# Patient Record
Sex: Female | Born: 1970 | Race: White | Hispanic: No
Health system: Western US, Academic
[De-identification: ages and names within clinical notes are randomized; demographics above are authoritative.]

## PROBLEM LIST (undated history)

## (undated) DIAGNOSIS — G43919 Migraine, unspecified, intractable, without status migrainosus: Secondary | ICD-10-CM

## (undated) DIAGNOSIS — F3289 Other specified depressive episodes: Secondary | ICD-10-CM

## (undated) DIAGNOSIS — Z87898 Personal history of other specified conditions: Secondary | ICD-10-CM

## (undated) DIAGNOSIS — F5102 Adjustment insomnia: Secondary | ICD-10-CM

## (undated) DIAGNOSIS — Z87820 Personal history of traumatic brain injury: Secondary | ICD-10-CM

## (undated) DIAGNOSIS — F4310 Post-traumatic stress disorder, unspecified: Secondary | ICD-10-CM

## (undated) DIAGNOSIS — J309 Allergic rhinitis, unspecified: Secondary | ICD-10-CM

## (undated) HISTORY — DX: Allergic rhinitis, unspecified: J30.9

## (undated) HISTORY — DX: Other specified depressive episodes: F32.89

## (undated) HISTORY — DX: Post-traumatic stress disorder, unspecified: F43.10

## (undated) HISTORY — DX: Personal history of other specified conditions: Z87.898

## (undated) HISTORY — DX: Personal history of traumatic brain injury: Z87.820

## (undated) HISTORY — DX: Migraine, unspecified, intractable, without status migrainosus: G43.919

## (undated) HISTORY — DX: Adjustment insomnia: F51.02

## (undated) NOTE — Progress Notes (Signed)
 Formatting of this note is different from the original.  SOPHIAROSE EADES  Jun 23, 1971    Subjective   Patient ID: Christina Mathews is a 5 YO female.    Chief Complaint   Patient presents with    Follow-up     HPI    Presents to follow up amenorrhea.  Still taking birth control pills, no menses.  Still notes hot flashes and night sweats.  Thinks she may have the start of a yeast infection.    Review of Systems    Objective   Vitals:    01/12/22 0830   BP: 138/80   Weight: 199 lb (90.3 kg)     Body mass index is 36.11 kg/m.    Physical Exam  Constitutional:       General: She is not in acute distress.     Appearance: She is obese. She is not ill-appearing or diaphoretic.   Genitourinary:      Right Labia: No rash, tenderness, lesions or skin changes.     Left Labia: No tenderness, lesions, skin changes or rash.     Vaginal discharge (yeast) and tenderness present.      No vaginal erythema, bleeding or ulceration.      No vaginal atrophy present.  Neurological:      Mental Status: She is alert.   Vitals reviewed. Exam conducted with a chaperone present.     Procedures      Assessment       SNOMED CT(R)    1. Candidiasis of female genitalia  CANDIDAL VULVOVAGINITIS fluconazole (DIFLUCAN) 150 mg tablet     2. Prolonged amenorrhea  AMENORRHEA              Plan     Patient doing well over all.  Plans to continue OCPs at this time but understands should transition to Transdermal patches for treatment of vasomotor symptoms and decrease risk of DVT, CV incidents.     Given rx for acute yeast infection and refill for international trip next month.     Patient Instructions   Instructions given today for  Follow up  , Method of instructions : Encouraged to call with any questions.  Return if symptoms worsen or fail to improve.      Electronically signed by Nadean Corwin, MD at 01/12/2022  9:28 AM EDT

---

## 1999-07-28 DIAGNOSIS — S02109A Fracture of base of skull, unspecified side, initial encounter for closed fracture: Secondary | ICD-10-CM

## 1999-07-28 HISTORY — DX: Fracture of base of skull, unspecified side, initial encounter for closed fracture: S02.109A

## 2003-11-11 ENCOUNTER — Ambulatory Visit: Payer: Self-pay

## 2010-09-03 ENCOUNTER — Encounter (INDEPENDENT_AMBULATORY_CARE_PROVIDER_SITE_OTHER): Payer: Self-pay | Admitting: Family Medicine

## 2010-09-03 ENCOUNTER — Ambulatory Visit (INDEPENDENT_AMBULATORY_CARE_PROVIDER_SITE_OTHER): Payer: No Typology Code available for payment source | Admitting: Family Medicine

## 2010-09-03 VITALS — BP 124/70 | HR 84 | Temp 99.2°F | Ht 62.5 in | Wt 162.0 lb

## 2010-09-03 MED ORDER — SUMATRIPTAN 5 MG/ACT NA SOLN
NASAL | Status: DC
Start: 2010-09-03 — End: 2010-10-30

## 2010-09-03 MED ORDER — FLUTICASONE PROPIONATE 50 MCG/ACT NA SUSP
NASAL | Status: DC
Start: 2010-09-03 — End: 2011-10-27
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
## 2010-09-08 ENCOUNTER — Ambulatory Visit (INDEPENDENT_AMBULATORY_CARE_PROVIDER_SITE_OTHER): Payer: No Typology Code available for payment source | Admitting: Obstetrics & Gynecology

## 2010-09-08 ENCOUNTER — Other Ambulatory Visit (HOSPITAL_BASED_OUTPATIENT_CLINIC_OR_DEPARTMENT_OTHER)
Admit: 2010-09-08 | Discharge: 2010-09-08 | Disposition: A | Discharge status: Home / Self Care | Payer: No Typology Code available for payment source | Attending: Family Medicine | Admitting: Family Medicine

## 2010-09-08 ENCOUNTER — Encounter (INDEPENDENT_AMBULATORY_CARE_PROVIDER_SITE_OTHER): Payer: Self-pay | Admitting: Obstetrics & Gynecology

## 2010-09-08 VITALS — BP 126/80 | HR 100 | Temp 98.3°F | Wt 162.0 lb

## 2010-09-08 DIAGNOSIS — Z01419 Encounter for gynecological examination (general) (routine) without abnormal findings: Secondary | ICD-10-CM | POA: Insufficient documentation
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
## 2010-09-08 NOTE — Progress Notes (Signed)
Why is patient here today? Patient is here for her annual gyn exam. She would also like to talk about future plans to conceive.   Does patient need refills? no  Does patient need a referral? no  Does patient need a letter or need forms filled out? no  Lab results? no  Please check health mainenance issues and flag for the doctor: tetanus, pap

## 2010-09-10 LAB — CERVICAL CANCER SCREENING: Cytologic Impression: NEGATIVE

## 2010-09-25 ENCOUNTER — Ambulatory Visit: Payer: No Typology Code available for payment source | Attending: Otolaryngology | Admitting: Otolaryngology

## 2010-09-25 DIAGNOSIS — M26609 Unspecified temporomandibular joint disorder, unspecified side: Secondary | ICD-10-CM | POA: Insufficient documentation

## 2010-09-25 DIAGNOSIS — S0993XA Unspecified injury of face, initial encounter: Secondary | ICD-10-CM | POA: Insufficient documentation

## 2010-09-25 DIAGNOSIS — S199XXA Unspecified injury of neck, initial encounter: Secondary | ICD-10-CM | POA: Insufficient documentation

## 2010-09-25 DIAGNOSIS — J309 Allergic rhinitis, unspecified: Secondary | ICD-10-CM | POA: Insufficient documentation

## 2010-10-03 ENCOUNTER — Ambulatory Visit: Payer: No Typology Code available for payment source | Attending: Otolaryngology

## 2010-10-03 ENCOUNTER — Other Ambulatory Visit: Payer: Self-pay | Admitting: Otolaryngology

## 2010-10-03 LAB — PR CT MAXILLOFACIAL AREA W/O CONTRAST

## 2010-10-21 ENCOUNTER — Other Ambulatory Visit: Payer: Self-pay

## 2010-10-21 ENCOUNTER — Other Ambulatory Visit (INDEPENDENT_AMBULATORY_CARE_PROVIDER_SITE_OTHER): Payer: Self-pay | Admitting: Family Medicine

## 2010-10-21 NOTE — Telephone Encounter (Signed)
From: Christina Mathews   To: Christina Mathews   Sent: Tue Oct 21, 2010 11:01 AM   Subject: Medication Renewal Request    Original authorizing provider: [name], MD    Christina Mathews would like a refill of the following medications:  SUMAtriptan (IMITREX) 5 MG/ACT Nasal Solution [[name], MD]    Preferred pharmacy: [id number]    Comment:  Request: 1. NOT generic for Necon 0.5/35, 28 2. Need higher dosage of SUMAtriptan: used to taking 50mg  tablets and 5mg  nasal not effective at all.  Thanks.    Medication renewals requested in this message routed to other providers:  Norethindrone-Eth Estradiol (NECON 0.5/35, 28,) 0.5-35 MG-MCG Oral Tab [Pcp Outside, MD]

## 2010-10-22 MED ORDER — NECON 0.5/35 (28) 0.5-35 MG-MCG OR TABS
ORAL_TABLET | ORAL | Status: DC
Start: 2010-10-22 — End: 2010-12-23

## 2010-10-22 NOTE — Telephone Encounter (Signed)
Patient is requesting higher dose of sumatriptan. States 5mg  nasal not effective at all.  Drug and pharmacy loaded if approved.

## 2010-10-22 NOTE — Telephone Encounter (Signed)
Patient with migraine with Aura on OCP     Will defer action on this to pcp

## 2010-10-23 MED ORDER — SUMATRIPTAN 20 MG/ACT NA SOLN
NASAL | Status: DC
Start: 2010-10-22 — End: 2011-12-04

## 2010-10-27 MED ORDER — NORETHINDRONE-ETH ESTRADIOL 0.5-35 MG-MCG OR TABS
ORAL_TABLET | ORAL | Status: DC
Start: 2010-10-25 — End: 2010-12-23

## 2010-10-29 ENCOUNTER — Encounter (INDEPENDENT_AMBULATORY_CARE_PROVIDER_SITE_OTHER): Payer: Self-pay | Admitting: Family Medicine

## 2010-11-13 ENCOUNTER — Telehealth (INDEPENDENT_AMBULATORY_CARE_PROVIDER_SITE_OTHER): Payer: Self-pay | Admitting: Family Medicine

## 2010-11-13 ENCOUNTER — Ambulatory Visit (INDEPENDENT_AMBULATORY_CARE_PROVIDER_SITE_OTHER): Payer: No Typology Code available for payment source | Admitting: Wound Care

## 2010-11-13 ENCOUNTER — Encounter (HOSPITAL_BASED_OUTPATIENT_CLINIC_OR_DEPARTMENT_OTHER): Payer: No Typology Code available for payment source

## 2010-11-13 VITALS — BP 110/80 | HR 68 | Temp 98.9°F | Resp 16 | Wt 163.0 lb

## 2010-11-13 NOTE — Telephone Encounter (Signed)
Triage Nurse Telephone Encounter  Chief Complaint: heartburn, nausea  Reviewed Problem List, Medications and Allergies: YES    Description of symptoms: Woke up with nausea, heartburn/chest pains in middle of night on Tuesday.  Took zantac.  Chest pain--middle of chest.  Headache and chest pains and nausea continue.  Slept all day yesterday,  Lots of fatigue  Took another zantac and tums but that doesn't seem to help.  Still has headache, nausea, chest pains.  ROS: negative per protocol except as noted in history  Protocol used for assessment: nausea  Recommended disposition: See provider within 24 hours  Caller agrees:  YES    PLAN: appt made  Home care instructions provided:  No  Instructed to call back or seek tx if sxs worsen or has new concerns: YES   Caller understands:  YES  Follow up call needed  NO  Reference used: [employer] 3rd edition, Telephone Triage Protocols for Nurses

## 2010-11-13 NOTE — Progress Notes (Signed)
Pt is a 40 year old female  here today for headache and nausea.  Reviewed history obtained by MA and agree on 11/13/2010    Pt states that she felt chest pain in sternum area in the middle of the night. Started 2 days back. It is a dull pressure. Thought it was heartburn but normal medication do not work. Pain 5-7/ 10.. Was bad yesterday. She also started with abdominal pain started after the pain.  She also notes headache -terrible headache /dizzy and neck area hurts. She is eating a bland diet . Drinking a lot of water.      Patient Active Problem List   Diagnoses   . ALLERGIC RHINITIS NOS   . 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 CHRONIC SINUSITIS NOS     Current Outpatient Prescriptions   Medication Sig   . B Complex Vitamins (VITAMIN B COMPLEX OR) once daily   . Cetirizine HCl (ZYRTEC) 10 MG Oral Tab 1 TABLET DAILY   . Fluticasone Propionate 50 MCG/ACT Nasal Suspension 1puff each nostril once or twice daily for prevention/control of nasal/sinus congestion   . Multiple Vitamin (MULTI-VITAMIN OR) once daily   . NECON 0.5/35, 28, 0.5-35 MG-MCG Oral Tab 1 tablet daily   . Norethindrone-Eth Estradiol (NECON 0.5/35, 28,) 0.5-35 MG-MCG Oral Tab once daily   . SUMAtriptan 20 MG/ACT Nasal Solution 1 spray into one nostril only at onset of headache.  May repeat 1 time in 2 hours if needed.   . SUMAtriptan Succinate (IMITREX) 25 MG Oral Tab 1 TABLET 1 TIME ONLY,REPEAT EVERY 2 HR AS NEEDED     History   Substance Use Topics   . Smoking status: Never Smoker    . Smokeless tobacco: Not on file   . Alcohol Use: Yes      once per month, if that       review of systems:  constitutional Positive for fatigue  ears, nose, throat Negative   cardiovascular As noted in HPI above  respiratory Negative   gastrointestinal  As noted in HPI above  genitourinary  Negative  musculoskeletal Negative     O/e  Gen alert    BP 110/80  Pulse 68  Temp(Src) 98.9 F (37.2 C) (Temporal)  Resp 16  Wt 163 lb (73.936 kg)  HEENT NEG  RS-LUNGS CLEAR  TO AUSCULTATION,NO RALESRHONCHI OR WHEEZES  [facility] RRR NO  M  ABDOMEN- SOFT, MILD RUQ TENDER,NON DISTENDED +BS   EXT- NO EDEMA,LESIONS,CALF TENDERNESS, 2+ PULSES       ASSESMENT AND PLAN  786.50 CHEST PAIN NOS  (primary encounter diagnosis)  Comment: WITH ABDOMINAL PAIN AND HEADACHE.  Plan: EKG        Not sure what is going on with paitent. She looks ill and I decided to send her to the ER. [facility].    787.02 NAUSEA ALONE  Comment: as above       789.06 ABDOMINAL PAIN EPIGASTRIC  Comment:    Greater than 50% of this visit was spent face to face with the patient in counseling and/or coordination of care, and/or discussing treatment/management options, and/or education with the patient and/or family member.  Total time spent was 30 minutes.

## 2010-11-13 NOTE — Progress Notes (Signed)
Why is patient here today? Pt is here and c/o chest pain, headache, nausea, extreme fatigue, right side abdominal pain, dizzy, and neck pain. Pt states that sx started on Tuesday. Pt states that she has tried Tums and Zantac and that has not help. Pt states that she has also tried Aleve and Advil for her headache and that ash not helped.   Pt noted drink water and eating a blain diet.        Any new significant medical diagnoses since last visit? NO    Refills? NO    Referral? NO    Letter or form? NO    Lab results? NO    Health maintenance issues? YES:

## 2010-11-14 ENCOUNTER — Telehealth (INDEPENDENT_AMBULATORY_CARE_PROVIDER_SITE_OTHER): Payer: Self-pay | Admitting: Wound Care

## 2010-11-14 NOTE — Telephone Encounter (Signed)
Left message for pt to call back at home number.

## 2010-11-14 NOTE — Telephone Encounter (Signed)
Staff I sent this patient to ER yesterday.  Can we see how she is doing ?      Thanks

## 2010-11-14 NOTE — Telephone Encounter (Signed)
Patient states she has been sleeping pretty much all morning.  Didn't get back until 10pm.  Still very exhausted weak and she has an upset stomach but hasnt eaten anything yet either.  Patient states she will call on Monday or Tuesday to follow up again and perhaps make a follow up appointment.

## 2010-12-10 ENCOUNTER — Encounter (INDEPENDENT_AMBULATORY_CARE_PROVIDER_SITE_OTHER): Payer: Self-pay | Admitting: Obstetrics & Gynecology

## 2010-12-10 ENCOUNTER — Ambulatory Visit (INDEPENDENT_AMBULATORY_CARE_PROVIDER_SITE_OTHER): Payer: No Typology Code available for payment source | Admitting: Family Medicine

## 2010-12-10 VITALS — BP 120/72 | HR 76 | Temp 99.0°F | Resp 14 | Wt 162.5 lb

## 2010-12-10 NOTE — Progress Notes (Signed)
SUBJECTIVE:  Christina Mathews is a 40 year old female presents with nonproductive cough, nasal congestion, facial pain (frontal and maxillary in location), ear congestion bilaterally and sore throat over the last 5 days.  Denies the following sx: productive cough, fever and chills.  Pt feels overall has been gradually improving.  Sick contacts:YES: at the office   .  Pt has self-treated so far with sudafed, nasal spray, neti pot..    Patient was seen 1 month ago by Dr Ennis Forts, note reviewed. She was sent to the ED due to the severity of her abdominal pain.  U/s, CT with contrast were negative.  Still getting some right sided abdominal pain intermittently.  She has noted slow digestion over the last couple weeks, not having constipation, just not a lot of stooling.  Her discomfort seems to be worse around eating or after eating.  Recent labs done by her naturopath show normal CMP, including normal liver function tests.   Cholesterol is normal.  CBC is normal.    History   Smoking status   . Never Smoker    Smokeless tobacco   . Not on file       Patient Active Problem List   Diagnoses   . ALLERGIC RHINITIS NOS   . 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 CHRONIC SINUSITIS NOS     Current Outpatient Prescriptions   Medication Sig   . B Complex Vitamins (VITAMIN B COMPLEX OR) once daily   . Cetirizine HCl (ZYRTEC) 10 MG Oral Tab 1 TABLET DAILY   . Fluticasone Propionate 50 MCG/ACT Nasal Suspension 1puff each nostril once or twice daily for prevention/control of nasal/sinus congestion   . Multiple Vitamin (MULTI-VITAMIN OR) once daily   . NECON 0.5/35, 28, 0.5-35 MG-MCG Oral Tab 1 tablet daily   . Norethindrone-Eth Estradiol (NECON 0.5/35, 28,) 0.5-35 MG-MCG Oral Tab once daily   . SUMAtriptan 20 MG/ACT Nasal Solution 1 spray into one nostril only at onset of headache.  May repeat 1 time in 2 hours if needed.   . SUMAtriptan Succinate (IMITREX) 25 MG Oral Tab 1 TABLET 1 TIME ONLY,REPEAT EVERY 2 HR AS NEEDED                  OBJECTIVE:  Constitutional: VS as above, healthy, alert, no distress  ENT: Ears:  R TM: normal, L TM: normal, Nose/sinus: Nares normal. Septum midline. Mucosa normal. Mild tenderness to palpation over the bilateral maxillary sinuses.,  Oropharynx: normal, Neck: supple and no adenopathy  Lungs: clear to auscultation  Heart: normal rate, regular rhythm and no murmurs, clicks, or gallops    Rapid strep screen done: NO    ASSESSMENT: Viral upper respiratory infection    PLAN:  reviewed symptomatic treatment  Followup if worse or no improvement in 5 days

## 2010-12-10 NOTE — Progress Notes (Signed)
Date ROI Received: 12/10/2010    Request Sent to: Dr. Park Meo    Request was sent via:Fax    Contact for follow ZO:XWRUE to [phone]

## 2010-12-10 NOTE — Progress Notes (Signed)
Why is patient here today? Patient is here for a URI x 5 days. She c/o sore throat, post nasal drip, bilateral ear pressure, fatigue and headaches.   No fevers. Several co-workers also sick.     Any new significant medical diagnoses since last visit? NO    Refills? NO    Referral? NO    Letter or form? NO    Lab results? NO    Health maintenance issues? NO

## 2010-12-18 ENCOUNTER — Encounter (INDEPENDENT_AMBULATORY_CARE_PROVIDER_SITE_OTHER): Payer: Self-pay | Admitting: Obstetrics & Gynecology

## 2010-12-19 NOTE — Telephone Encounter (Signed)
I will keep an eye out for LEEP report.  FYI to [facility] RN in case you see these records.

## 2010-12-23 ENCOUNTER — Encounter (INDEPENDENT_AMBULATORY_CARE_PROVIDER_SITE_OTHER): Payer: Self-pay | Admitting: Family Medicine

## 2010-12-23 NOTE — Telephone Encounter (Signed)
Ok to send to pharmacy with continuous dose on prescription information?

## 2010-12-23 NOTE — Telephone Encounter (Signed)
Reviewed, defer to pcp/ original provider  in clinic tomorrow

## 2010-12-24 MED ORDER — NECON 0.5/35 (28) 0.5-35 MG-MCG OR TABS
ORAL_TABLET | ORAL | Status: DC
Start: 2010-12-23 — End: 2011-06-29

## 2010-12-24 NOTE — Telephone Encounter (Signed)
Can you call pharmacy and see why patient cannot pick up?

## 2010-12-24 NOTE — Telephone Encounter (Signed)
See Ecare message.

## 2011-01-07 ENCOUNTER — Ambulatory Visit: Payer: No Typology Code available for payment source | Attending: Neurology | Admitting: Neurology

## 2011-01-07 DIAGNOSIS — G43019 Migraine without aura, intractable, without status migrainosus: Secondary | ICD-10-CM | POA: Insufficient documentation

## 2011-03-23 ENCOUNTER — Telehealth (INDEPENDENT_AMBULATORY_CARE_PROVIDER_SITE_OTHER): Payer: Self-pay | Admitting: Obstetrics & Gynecology

## 2011-03-23 NOTE — Telephone Encounter (Signed)
FYI Jenn: We were on the lookout a few months ago for these LEEP records that we never received. I asked Pt if we could contact where she had her LEEP performed to track down the records since she has already signed a ROI.    Pt signed a ROI on 12/18/2010 so [place] could obtain her LEEP records from 1998 (she has this procedure performed in [place]).    We never received her medical records. Sent eCare message to Pt asking where she had this procedure performed so we can contact them.    Pt instructed to call the clinic with any further questions or concerns.    Marval Regal, OB/GYN RN

## 2011-03-24 NOTE — Telephone Encounter (Signed)
Fax sent to Dr. Park Meo requested Pt's medical records at: Fax [phone]    Letter mailed requesting Pt's medical records to:  Address  [address]  [place], [place] [address]  and  [address], [address]  [place], [place] [address]    If medical records are still not received, will try to send a ROI to Provider's office again.    Marval Regal, OB/GYN RN

## 2011-03-24 NOTE — Telephone Encounter (Signed)
Pt sent eCare message back with where she had her LEEP performed in 1998:    Phone  [phone]    Fax  [phone]    Address  [address] [place], [address]  [place], [place] [address]  and  [address], [address]  [place], [place] [address]    E-mail  [email or web address] [email or web address]

## 2011-04-01 NOTE — Telephone Encounter (Addendum)
Called Dr. Tawana Scale office at [phone] to request Pt's LEEP records from 1998 (Pt's name when this was performed was Christina Mathews with a DOB [birth year]).    Left detailed voicemail with the above information.  Instructed to please call our clinic clinic back so we can obtain these medical records. An ROI was sent a few months ago, but never reached this office.    Marval Regal, OB/GYN RN

## 2011-04-02 ENCOUNTER — Telehealth (INDEPENDENT_AMBULATORY_CARE_PROVIDER_SITE_OTHER): Payer: Self-pay | Admitting: Obstetrics & Gynecology

## 2011-04-02 NOTE — Telephone Encounter (Signed)
FYI Dr. Mickle Asper & Britt Boozer: We were trying to track down this Pt's LEEP records from [phone]. Lowella Dandy spoke with medical records at her old clinic and they have no record of Pt ever being seen there (not under her maiden name Stewart Pimenta either). I let Pt know to contact the clinic herself and see what happened with all of her medical records. Thanks!

## 2011-04-02 NOTE — Telephone Encounter (Signed)
Called Dr. Tawana Scale office at [phone] on 04/02/2011  to request Pt's LEEP records from 1998 (Pt's name when this was performed was Christina Mathews with a DOB 1971/04/05).    Left detailed voicemail with the above information.   Instructed to please call our clinic clinic back so we can obtain these medical records. An ROI was sent a few months ago, but never reached this office.     Dr. Tawana Scale office returned phone call to the [facility] clinic stating that they have no medical records for this patient.  Per this clinic, Pt was never seen there. Will eCare message Pt letting her know that the clinic information/ name she provided [place] with does not have any medical records for her.    Pt instructed to call the clinic with any further questions or concerns.    Marval Regal, OB/GYN RN

## 2011-04-03 NOTE — Telephone Encounter (Signed)
Sent eCare message to Pt indicating the below stated plan of care per Dr. Mickle Asper.    Pt instructed to call the clinic with any further questions or concerns.    Marval Regal, OB/GYN RN

## 2011-04-03 NOTE — Telephone Encounter (Signed)
Thanks for trying to track this down. 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 After that she can start to space out her pap smears if they remain normal.     Thanks again

## 2011-04-21 ENCOUNTER — Encounter (HOSPITAL_BASED_OUTPATIENT_CLINIC_OR_DEPARTMENT_OTHER): Payer: Self-pay | Admitting: Obstetrics & Gynecology

## 2011-04-23 ENCOUNTER — Encounter (INDEPENDENT_AMBULATORY_CARE_PROVIDER_SITE_OTHER): Payer: Self-pay | Admitting: Family Medicine

## 2011-04-23 NOTE — Telephone Encounter (Signed)
Christina Pt. is requesting your advise on a mental health provider. See e-care message and advise

## 2011-04-27 ENCOUNTER — Encounter (HOSPITAL_BASED_OUTPATIENT_CLINIC_OR_DEPARTMENT_OTHER): Payer: No Typology Code available for payment source | Admitting: Neurology

## 2011-06-28 ENCOUNTER — Other Ambulatory Visit (INDEPENDENT_AMBULATORY_CARE_PROVIDER_SITE_OTHER): Payer: Self-pay | Admitting: Family Medicine

## 2011-06-29 ENCOUNTER — Encounter (HOSPITAL_BASED_OUTPATIENT_CLINIC_OR_DEPARTMENT_OTHER): Payer: No Typology Code available for payment source | Admitting: Neurology

## 2011-06-29 MED ORDER — NECON 0.5/35 (28) 0.5-35 MG-MCG OR TABS
ORAL_TABLET | ORAL | Status: DC
Start: 2011-06-29 — End: 2011-10-27

## 2011-06-29 NOTE — Telephone Encounter (Signed)
From: Christina Mathews   To: [name]   Sent: Sun Jun 28, 2011 10:45 PM   Subject: Medication Renewal Request    Original authorizing provider: [name], MD    Christina Mathews would like a refill of the following medications:  NECON 0.5/35, 28, 0.5-35 MG-MCG Oral Tab [[name], MD]    Preferred pharmacy: [id number] [address] [place] [phone]     Comment:  Hi Dr. Vickey Sages,  I have no refills remaining on the Necon, and am at my last week. I will need to pick-up new packs on Saturday, Dec. 8th in order to start a new pack on Sunday.  I do skip the pacebo pills and take continually.  My pharmacy is [facility] on [address] in [place].  Many thanks, [name]

## 2011-10-27 ENCOUNTER — Ambulatory Visit (INDEPENDENT_AMBULATORY_CARE_PROVIDER_SITE_OTHER): Payer: No Typology Code available for payment source | Admitting: Obstetrics & Gynecology

## 2011-10-27 ENCOUNTER — Encounter (INDEPENDENT_AMBULATORY_CARE_PROVIDER_SITE_OTHER): Payer: Self-pay | Admitting: Obstetrics & Gynecology

## 2011-10-27 ENCOUNTER — Other Ambulatory Visit (HOSPITAL_BASED_OUTPATIENT_CLINIC_OR_DEPARTMENT_OTHER)
Admit: 2011-10-27 | Discharge: 2011-10-27 | Disposition: A | Discharge status: Home / Self Care | Payer: No Typology Code available for payment source | Attending: Obstetrics & Gynecology | Admitting: Obstetrics & Gynecology

## 2011-10-27 VITALS — BP 132/80 | HR 84 | Temp 99.0°F | Ht 62.5 in | Wt 160.0 lb

## 2011-10-27 DIAGNOSIS — Z01419 Encounter for gynecological examination (general) (routine) without abnormal findings: Secondary | ICD-10-CM | POA: Insufficient documentation

## 2011-10-27 DIAGNOSIS — Z1151 Encounter for screening for human papillomavirus (HPV): Secondary | ICD-10-CM | POA: Insufficient documentation

## 2011-10-27 MED ORDER — NECON 0.5/35 (28) 0.5-35 MG-MCG OR TABS
ORAL_TABLET | ORAL | Status: DC
Start: 2011-10-27 — End: 2012-04-06
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
## 2011-10-27 NOTE — Progress Notes (Signed)
Patient presents for annual gyn exam. Last pap 09/08/10.

## 2011-10-28 NOTE — Addendum Note (Signed)
Addended by: Ovidio Kin on: 10/28/2011 10:31 AM     Modules accepted: Level of Service

## 2011-10-28 NOTE — Addendum Note (Signed)
Addended by: Theresa Duty on: 10/28/2011 10:32 AM     Modules accepted: Orders

## 2011-10-28 NOTE — Addendum Note (Signed)
Addended by: Ovidio Kin on: 10/28/2011 10:17 AM     Modules accepted: Level of Service

## 2011-10-28 NOTE — Addendum Note (Signed)
Addended by: Ovidio Kin on: 10/28/2011 10:30 AM     Modules accepted: Level of Service

## 2011-10-28 NOTE — Addendum Note (Signed)
Addended by: Elwyn Reach ROSE on: 10/28/2011 09:44 AM     Modules accepted: Level of Service

## 2011-10-29 LAB — CERVICAL CANCER SCREENING: Cytologic Impression: NEGATIVE

## 2011-10-30 LAB — HPV ONLY

## 2011-11-04 ENCOUNTER — Encounter (HOSPITAL_BASED_OUTPATIENT_CLINIC_OR_DEPARTMENT_OTHER): Payer: Self-pay | Admitting: Obstetrics & Gynecology

## 2011-12-04 ENCOUNTER — Other Ambulatory Visit: Payer: Self-pay | Admitting: Pharmacist

## 2011-12-04 NOTE — Telephone Encounter (Signed)
This medication is outside of the Refill Center's protocols. Please sign and close the encounter if you approve: sumatriptan.  Last refilled on 10/01/2011.  Last visit on 10/27/11 was for pre conception counseling.   Defer to md to authorize.     If this medication is denied please have your staff inform the patient and schedule an appointment if necessary.

## 2011-12-05 MED ORDER — SUMATRIPTAN 20 MG/ACT NA SOLN
NASAL | Status: DC
Start: 2011-12-04 — End: 2013-01-10

## 2011-12-05 NOTE — Telephone Encounter (Signed)
Done

## 2012-04-05 ENCOUNTER — Encounter (INDEPENDENT_AMBULATORY_CARE_PROVIDER_SITE_OTHER): Payer: Self-pay | Admitting: Family Medicine

## 2012-04-06 ENCOUNTER — Ambulatory Visit (INDEPENDENT_AMBULATORY_CARE_PROVIDER_SITE_OTHER): Payer: No Typology Code available for payment source | Admitting: Family Medicine

## 2012-04-06 ENCOUNTER — Encounter (INDEPENDENT_AMBULATORY_CARE_PROVIDER_SITE_OTHER): Payer: Self-pay | Admitting: Family Medicine

## 2012-04-06 VITALS — BP 114/72 | HR 77 | Temp 97.2°F | Wt 165.4 lb

## 2012-04-06 LAB — CBC, DIFF
% Basophils: 0 %
% Eosinophils: 1 %
% Immature Granulocytes: 0 %
% Lymphocytes: 35 %
% Monocytes: 8 %
% Neutrophils: 56 %
Absolute Eosinophil Count: 0.11 10*3/uL (ref 0.00–0.50)
Absolute Lymphocyte Count: 3.47 10*3/uL (ref 1.00–4.80)
Basophils: 0.02 10*3/uL (ref 0.00–0.20)
Hematocrit: 40 % (ref 36–45)
Hemoglobin: 13.9 g/dL (ref 11.5–15.5)
Immature Granulocytes: 0.02 10*3/uL (ref 0.00–0.05)
MCH: 30.1 pg (ref 27.3–33.6)
MCHC: 34.4 g/dL (ref 32.2–36.5)
MCV: 87 fL (ref 81–98)
Monocytes: 0.76 10*3/uL (ref 0.00–0.80)
Neutrophils: 5.55 10*3/uL (ref 1.80–7.00)
Platelet Count: 273 10*3/uL (ref 150–400)
RBC: 4.62 mil/uL (ref 3.80–5.00)
RDW-CV: 12.5 % (ref 11.6–14.4)
WBC: 9.93 10*3/uL (ref 4.3–10.0)

## 2012-04-06 LAB — PR U/A AUTO DIPSTICK ONLY, ONSITE
Bilirubin (Indirect): NEGATIVE
Glucose, Urine: NEGATIVE mg/dL
Ketones, URN: NEGATIVE mg/dL
Leukocytes: NEGATIVE
Nitrite, URN: NEGATIVE
Occult Blood, URN: NEGATIVE
Protein: NEGATIVE mg/dL
Specific Gravity, URN: 1.015 (ref 1.005–1.030)
Urobilinogen, URN: NORMAL E.U./dL
pH, Whole Blood: 7.5 (ref 5.0–8.0)

## 2012-04-06 MED ORDER — NECON 0.5/35 (28) 0.5-35 MG-MCG OR TABS
ORAL_TABLET | ORAL | Status: DC
Start: 2012-04-06 — End: 2013-03-31

## 2012-04-06 NOTE — Progress Notes (Signed)
Why is patient here today? Patient is being seen today for ongoing fatigue, dizziness, restlessness, cold and flu symptoms. Patient details these symptoms out in TE. Symptoms have been occurring for about 8 months, and would like to find out what is causing her symptoms.     Any new significant medical diagnoses since last visit? NO    Refills? Yes, patient needs necon without placebo    Referral? NO    Letter or form? NO    Lab results? NO    Health maintenance issues? YES, Tdap

## 2012-04-06 NOTE — Patient Instructions (Addendum)
When you are having a sweat, take your temperature.    Take just a vitamin D supplement, 800 units at a time.    Please make follow up appointment to discuss labs

## 2012-04-06 NOTE — Progress Notes (Signed)
Patient is a 41 year old female here for the following reasons:    S:  780.79 Fatigue  (primary encounter diagnosis)  784.0 Headache  478.19 Nasal congestion  approx 1 yr ago, around the fall, patient first started noting symptoms.  There was a variety of personal stressors, including family planning, buying a home, etc.  She went to see a counselor, which was helpful.    Then in 1/13, got a stomach virus and a URI and since then has not improved.  Some weeks she has congestion and allergies.  Peth/clear nasal discharge.  Sometimes has some wheezing, shortness of breath.  Spring has been typically the worst time for her allergies, and she usually sees an acupuncturist and does saline nasal rinses.  This year, it helped to some degree.    In addition has stomach bloating, fatigue, decreased appetite. When she had the stomach flu, she had diarrhea and nausea that felt prolonged.  Thought she was pregnant, but she was not.  Periodically would have nausea, feelings of "fuzzy" thinking, felt faint, constant bloating.    She took a vacation, which helped, and started exercising (walking and elliptical), which she is continuing but this is not helping.    Went to her acupuncturist.  He recommended desensitization for her allergies.  Saw a naturopath.  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  She has not been eating dairy or egg for over 2 years.  Also tested TSH, normal; ferritin, normal     She did an oral desensitization trial for the soy and gluten allergies, felt even worse.    On oral contraceptive pill x 20 years.  Sexually active with husband only.  Takes continuous OCPs, no menses.  Has had spotting in the last couple of months.  Has taken preg test recently, neg.    History of migraines.  1 in the last 3 months, but prior to that having 1-2 every month.  No aura.    Taking a multivitamin.    [place]    She is taking vit D/mg/ca, 3 tabs at a time, x >1 yr.  Taking 3 at a  time, thinks it may be higher than recommended dose.      Family History   Problem Relation Age of Onset   . Alcohol/Drug Father    . Diabetes Father    . Alcohol/Drug Paternal Grandmother    . Diabetes Paternal Grandmother    . Cancer Maternal Grandfather      leukemia   . Cancer Maternal Uncle      pancreatic   . Breast Cancer Aunt/Uncle      paternal   . Diabetes Aunt/Uncle      on father's side   . Cancer Maternal Grandmother      pancreatic cancer     History     Social History   . Marital Status: Married     Spouse Name: N/A     Number of Children: N/A   . Years of Education: N/A     Occupational History   . Not on file.     Social History Main Topics   . Smoking status: Never Smoker    . Smokeless tobacco: Not on file   . Alcohol Use: No      once per month, if that; none, 04/06/12   . Drug Use: No      confirmed, 04/06/12   . Sexually Active: Not on file  Other Topics Concern   . Not on file     Social History Narrative    09/02/10: Lives with her husband, Brett Canales.  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       ROS:  Constit:  Weight stable.  Is having some cold sweats, has not taken her temperature.  GU: no menses (takes continuous oral contraceptive pill), but has had some spotting in the spring.  Musculoskeletal:  Endorses body aches, all the joints ache.  Worst in the morning and at night.  In the AM, worst in the first 2 hours.  When she gets up her ankles are sore and she has difficulty standing.  No joint swelling.  Skin: no rashes    Psych:  Denies depressed mood, anhedonia.  Sleep difficulty: falls asleep, but wakes up, usually due to sweating.  Denies flashbacks or vivid dreams.PTSD symptoms have been quiescent.  Denies anxiety.  O:   BP 114/72  Pulse 77  Temp 97.2 F (36.2 C) (Temporal)  Wt 165 lb 6.4 oz (75.025 kg)  SpO2 100%  Gen: no acute distress  HEENT: conjuc/sclerae clear bilaterally.  OP clear, no  erythema or lesions noted.  Neck: fullness in left side of neck, no discrete palpable lymphadenopathy in anterior or posterior cervical chain.  No thyromegaly or nodules  CV: regular rate and rhythm, no murmurs, rubs, or gallops  Resp: CLEAR TO AUSCULTATION BILATERALLY, no wheezes, crackles, or rales  Breast:  No skin discoloration, no palpable masses, no axillary lymphadenopathy, no nipple discharge.  Abd: soft, nontender, nondistended, no palpable masses or hepatosplenomegaly  Lymph: no axillary, inguinal lymphadenopathy.  Ext: warm, no edema, 2+ radial and DP pulses bilaterally. No deformities,, joint swelling, or nail deformities noted.  Skin: no rashes or concerning lesions noted.  Full skin exam not performed today.  PSYCHIATRIC:  Judgement/insight:  Normal, Mood/affect:  Normal., Orientation:  Normal.    A/P:  780.79 Fatigue  (primary encounter diagnosis)  784.0 Headache  Comment: it is not clear what is causing patient's symptoms.  Allergies could be a part of the issue.  Will also check for anemia, B12 deficiency, given her diet restrictions though she does eat some meat.  She is taking above the recommended dosage of vit D, check for hypervitaminosis.  TSH has been recently normal.   Urine dip to rule out occult infection.  basic metabolic panel to check electrolytes and glucose.  Plan: CBC, DIFF, U/A AUTO DIPSTICK ONLY, VITAMIN B12         (COBALAMIN), BASIC METABOLIC PANEL, VITAMIN D         (25 HYDROXY)          478.19 Nasal congestion  477.9 Seasonal allergies  Comment: worse this year.  May in part be causing her fatigue.  Plan: REFERRAL TO ALLERGY/IMMUNOLOGY            784.2 Neck swelling  Comment: subtle swelling in left side of neck.  Check CBC and u/s of neck to rule out lymphadenopathy   Plan: CBC, DIFF, US EXAM OF HEAD AND NECK            Follow up after studies for review of results

## 2012-04-07 LAB — BASIC METABOLIC PANEL
Anion Gap: 9 (ref 3–11)
Calcium: 8.9 mg/dL (ref 8.9–10.2)
Carbon Dioxide, Total: 23 mEq/L (ref 22–32)
Chloride: 103 mEq/L (ref 98–108)
Creatinine: 0.84 mg/dL (ref 0.38–1.02)
GFR, Calc, African American: >60 mL/min (ref >59)
GFR, Calc, European American: >60 mL/min (ref >59)
Glucose: 86 mg/dL (ref 62–125)
Potassium: 4.4 mEq/L (ref 3.7–5.2)
Sodium: 135 mEq/L — ABNORMAL LOW (ref 136–145)
Urea Nitrogen: 5 mg/dL — ABNORMAL LOW (ref 8–21)

## 2012-04-07 LAB — VITAMIN B12 (COBALAMIN)
Vitamin B12 (Cobalamin): 623 pg/mL (ref 180–914)
Vitamin B12 (Cobalamin): NORMAL

## 2012-04-08 LAB — VITAMIN D (25 HYDROXY)
Vit D (25_Hydroxy) Interp: HIGH
Vit D (25_Hydroxy) Total: 50.6 ng/mL — ABNORMAL HIGH (ref 20.1–50.0)
Vitamin D2 (25_Hydroxy): 7.4 ng/mL
Vitamin D3 (25_Hydroxy): 43.2 ng/mL

## 2012-04-09 ENCOUNTER — Encounter (INDEPENDENT_AMBULATORY_CARE_PROVIDER_SITE_OTHER): Payer: Self-pay | Admitting: Family Medicine

## 2012-04-18 ENCOUNTER — Encounter (INDEPENDENT_AMBULATORY_CARE_PROVIDER_SITE_OTHER): Payer: Self-pay | Admitting: Family Medicine

## 2012-04-25 ENCOUNTER — Telehealth (INDEPENDENT_AMBULATORY_CARE_PROVIDER_SITE_OTHER): Payer: Self-pay | Admitting: Family Medicine

## 2012-04-25 ENCOUNTER — Ambulatory Visit (INDEPENDENT_AMBULATORY_CARE_PROVIDER_SITE_OTHER): Payer: No Typology Code available for payment source | Admitting: Family Medicine

## 2012-04-25 ENCOUNTER — Encounter (INDEPENDENT_AMBULATORY_CARE_PROVIDER_SITE_OTHER): Payer: Self-pay | Admitting: Family Medicine

## 2012-04-25 VITALS — BP 124/79 | HR 72 | Temp 99.0°F | Resp 14 | Wt 168.0 lb

## 2012-04-25 MED ORDER — RIZATRIPTAN BENZOATE 5 MG OR TABS
ORAL_TABLET | ORAL | Status: DC
Start: 2012-04-25 — End: 2013-01-10

## 2012-04-25 MED ORDER — NAPROXEN 500 MG OR TABS
ORAL_TABLET | ORAL | Status: DC
Start: 2012-04-25 — End: 2013-08-17

## 2012-04-25 NOTE — Progress Notes (Signed)
Why is patient here today? Discuss recent lab results and [place] report    Also discuss management of headaches, at times, taking a left-over Vicodin she has helps more than the Imitrex, would like to discuss Rx for this.      Discuss flu shot, pt not feeling well today.      Any new significant medical diagnoses since last visit? NO    Refills? NO    Referral? NO    Letter or form? NO    Lab results? YES    Health maintenance issues? YES, Tdap

## 2012-04-25 NOTE — Patient Instructions (Addendum)
Please make an appointment with the allergist.    I will email you with the results of the blood tests.    I have faxed maxalt to your pharmacy.  Take 1 tab at the first sign of migraine, and can repeat x 2 if symptoms persist.  Also try the high dose of naproxen that I have faxed to your pharmacy.  Also take this at the first sign of migraine, and take with food.

## 2012-04-25 NOTE — Telephone Encounter (Signed)
Please re-request [facility] ultrasound results or find in the scan pile--I know I have seen them and reviewed them, but they are not yet in chart  Thanks

## 2012-04-25 NOTE — Progress Notes (Signed)
Patient is a 41 year old female here for the following reasons:  Follow up fatigue    S:  Fatigue: please see full history from 04/06/12 note.  She is feeling the same  All blood testing was normal.  Ultrasound of neck was normal, no lymphadenopathy.  It is not yet scanned into chart.  She has not yet seen allergist    She does continue to have pain in her joints, specifically in AM.  Stiff, improves with moving, takes a couple hours.  She continues to have neck pain and stiffness off and on, wondering if due to her allergies.    Constit: no fevers, chills, night sweats. Weight stable    O:   BP 124/79  Pulse 72  Temp 99 F (37.2 C) (Temporal)  Resp 14  Wt 168 lb (76.204 kg)  Gen: no acute distress  Exam deferred today    A/P:  729.1 Myalgia  (primary encounter diagnosis)  Comment: labs ordered to rule out inflammatory disorder  Plan: SED RATE, CRP, HIGH SENSITIVITY, ANTI C.         CITRULLINATED PEPTIDE2            780.79 Fatigue  Comment: we discussed normal labs to date, as well as u/s.  We'll see what allergist has to say.  Discussed possible diagnosis of chronic fatigue or fibromyalgia, but some more diagnostic work needs to be done at this time  Plan: SED RATE, CRP, HIGH SENSITIVITY, ANTI C.         CITRULLINATED PEPTIDE2            346.90 MIGRAINE NOS W/O MENTN INTRACTABLE  Comment: imitrex not working.  Had bad headache this weekend.  Will change to maxalt and rx'd a prescription strength NSAID  Plan: Rizatriptan Benzoate (MAXALT) 5 MG Oral Tab,         Naproxen 500 MG Oral Tab

## 2012-04-26 LAB — ANTI C. CITRULLINATED PEPTIDE2
Anti CCP2 Comment: NEGATIVE
Anti CCP2 Level: <0.5 U/mL (ref 0.0–1.5)

## 2012-04-26 LAB — C_REACTIVE PROTEIN: C_Reactive Protein: 13.3 mg/L — ABNORMAL HIGH (ref 0.0–10.0)

## 2012-04-26 LAB — SED RATE: Erythrocyte Sedimentation Rate: 5 mm/hr (ref 0–20)

## 2012-04-26 NOTE — Telephone Encounter (Signed)
Found ultrasound report. Placing on Dr Vickey Sages desk.

## 2012-04-28 ENCOUNTER — Encounter (INDEPENDENT_AMBULATORY_CARE_PROVIDER_SITE_OTHER): Payer: Self-pay | Admitting: Family Medicine

## 2012-07-14 ENCOUNTER — Encounter (INDEPENDENT_AMBULATORY_CARE_PROVIDER_SITE_OTHER): Payer: Self-pay | Admitting: Family Medicine

## 2012-07-14 ENCOUNTER — Ambulatory Visit (INDEPENDENT_AMBULATORY_CARE_PROVIDER_SITE_OTHER): Payer: No Typology Code available for payment source | Admitting: Family Medicine

## 2012-07-14 VITALS — BP 118/72 | HR 69 | Temp 99.7°F | Resp 12 | Wt 171.0 lb

## 2012-07-14 MED ORDER — CYCLOBENZAPRINE HCL 10 MG OR TABS
ORAL_TABLET | ORAL | Status: DC
Start: 2012-07-14 — End: 2014-01-28

## 2012-07-14 NOTE — Progress Notes (Signed)
Why is patient here today? Back pain, 5 am today, no MOI    Any new significant medical diagnoses since last visit? NO    Refills? NO    Referral? NO    Letter or form? NO    Lab results? NO    Health maintenance issues? YES  tdap  Flu-egg allergy

## 2012-07-14 NOTE — Patient Instructions (Signed)
New medications  you are starting this visit are FLEXERIL 10 MG  Tab  2 - 3 times per day as needed for your muscle spasm / pain.    PLEASE BE AWARE IT WILL CAN MAKE YOU DROWSY : TRY IT BEFORE PUTTING YOURSELF IN A SITUATION WHERE DROWSINESS COULD HAVE DANGEROUS CONSEQUENCES.        ===========================================================================    I have given you copies of exercises and information entitled  Upper back pain  Rehabilitation Exercises from the Sports Medicine Advisor.  This excellent resource will enable you to do PHYSICAL THERAPY type exercises at home.  Please do them to help with your condition, unless they cause you increasing or unreasonable pain, in which case please let me know.  Please consider continuing the exercises even after you improve in order to continue to strengthen the affected area and prevent future reinjury.  If they do not help, please come see [place] again.      ===========================================================================    You have been referred to Physical Therapy  to help with your upper back pain .      You should choose a provider that you like and is convenient for you in terms of hours and commute.  Please do the exercises they give to you at home as well.    PLEASE NOTE: IT IS IMPORTANT THAT YOU VERIFY WITH YOUR INSURANCE COMPANY THAT THE PROVIDER ACCEPTS [facility]. REFERRAL AUTHORIZATION IS NOT A GUARANTEE OF PAYMENT. FINAL DETERMINATION IS MADE BY YOUR INSURANCE COMPANY WHEN THE CLAIM IS SUBMITTED AND IS SUBJECT TO ELIGIBILITY.    ================================================================        You have been referred to Massage Therapy  to help with your back pain .      PLEASE NOTE: IT IS IMPORTANT THAT YOU VERIFY WITH YOUR INSURANCE COMPANY THAT THE PROVIDER ACCEPTS [facility]. REFERRAL AUTHORIZATION IS NOT A GUARANTEE OF PAYMENT. FINAL DETERMINATION IS MADE BY YOUR INSURANCE COMPANY WHEN THE CLAIM IS SUBMITTED  AND IS SUBJECT TO ELIGIBILITY.    ================================================================    Please take Ibuprofen 600 mg every 6 - 8 hours for pain;     You can also take Naproxen / Alleve 440 mg every 12 hours INSTEAD of the ibuprofen.   Naproxen comes in 220 mg tabs; take 2.     Be sure to take the ibuprofen with food and water so you don't hurt your stomach or kidneys.     You can also alternate with Acetaminophen  / Tylenol 650 mg every 8  hours since    MAXIMUM  3000 mg / 3 grams per day  Be careful since over the counter cold remedies often have acetaminophen in them and you can accidentally take too much   '     acetaminophen is metabloized by the liver and ibuprofen / naproxen is metabolized by the kidneys.
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
## 2012-08-03 ENCOUNTER — Telehealth (INDEPENDENT_AMBULATORY_CARE_PROVIDER_SITE_OTHER): Payer: Self-pay | Admitting: Family Medicine

## 2012-08-03 NOTE — Telephone Encounter (Signed)
Call patient re left sided chest pain

## 2012-08-06 NOTE — Telephone Encounter (Signed)
Staff:    1)  Please call patient      2)  See if her chest pain resolved          Thanks  Pennie Rushing

## 2012-08-08 NOTE — Telephone Encounter (Signed)
Follow-up Telephone Call Documentation    Are your symptoms improving: YES, still a little on and off tightness. Feels like she needs to stretch out. She does, this helps temporarily. Severe pain of not being able to breathe is resolved. Feels that she is fighting a little cold right now. Not as painful. Patient is not too concerned.

## 2012-12-30 ENCOUNTER — Other Ambulatory Visit: Payer: Self-pay

## 2013-01-04 ENCOUNTER — Other Ambulatory Visit: Payer: Self-pay | Admitting: Pharmacist

## 2013-01-04 NOTE — Telephone Encounter (Signed)
Please clarify with patient (see below) what she is taking for her migraines and what she needs refilled.  Looks like we just refilled maxalt yesterday

## 2013-01-04 NOTE — Telephone Encounter (Signed)
The medication requested from the pharmacy does not match current notes. Please clarify the dose/sig and authorize the prescription.    Per 04/25/12: "imitrex not working. Had bad headache this weekend. Will change to maxalt and rx'd a prescription strength NSAID"    Requested: Imitrex (sumatriptan) 20 mg nasal spray - 6 nasal spray units last filled 10/30/12    Filled Maxalt (rizatriptan) 5 mg tablet on 01/03/13 #12 tablets    Okay to use both the Maxalt tablets and Imitrex nasal spray?

## 2013-01-06 NOTE — Telephone Encounter (Signed)
Left message on voice mail will cb tomorrow     Ember @ ext [phone] until 5

## 2013-01-06 NOTE — Telephone Encounter (Signed)
Refill encounter still open after 48 hours.

## 2013-01-09 NOTE — Telephone Encounter (Signed)
Unable to reach     Closing TE

## 2013-01-10 ENCOUNTER — Telehealth (INDEPENDENT_AMBULATORY_CARE_PROVIDER_SITE_OTHER): Payer: Self-pay | Admitting: Family Medicine

## 2013-01-10 MED ORDER — SUMATRIPTAN 20 MG/ACT NA SOLN
NASAL | Status: DC
Start: 2013-01-10 — End: 2013-01-11

## 2013-01-10 NOTE — Telephone Encounter (Signed)
Refilled

## 2013-01-10 NOTE — Telephone Encounter (Signed)
Spoke to patient.   She is following up on Sumatriptan refill--see 6/11 encounter closed after we were unable to contact patient.     She is wanting refill of sumatriptan nasal spray.   She wants a larger qty per fill if possible.     She has been taking Maxalt for migraines but prefers the sumatriptan nasal spray.   She states it acts much more quickly.

## 2013-01-10 NOTE — Telephone Encounter (Signed)
CONFIRMED PHONE NUMBER: [phone]  CALLERS FIRST AND LAST NAME: Christina Mathews  FACILITY NAME: N/A TITLE: N/A  CALLERS RELATIONSHIP: Self  RETURN CALL: General message OK     SUBJECT: General Message   REASON FOR REQUEST: Medication Management    MESSAGE: Pt missed multiple calls from the clinic regarding medication management (please see closed TE dated 01/04/13). Pt is requesting another call back as soon as possible.

## 2013-01-10 NOTE — Telephone Encounter (Addendum)
Patient called to check the status of this, stating she was advised she would get a call in a couple hours. MA not available. Patient requested to speak with the front desk and the front desk declined to take the call. Transferred to Programmer, applications.

## 2013-01-10 NOTE — Telephone Encounter (Signed)
Reviewed, defer to provider / pcp in clinic tomorrow

## 2013-01-11 ENCOUNTER — Ambulatory Visit (INDEPENDENT_AMBULATORY_CARE_PROVIDER_SITE_OTHER): Payer: No Typology Code available for payment source | Admitting: Family Medicine

## 2013-01-11 VITALS — BP 120/76 | HR 80 | Temp 98.8°F | Resp 18 | Wt 180.0 lb

## 2013-01-11 MED ORDER — AMOXICILLIN-POT CLAVULANATE 500-125 MG OR TABS
ORAL_TABLET | ORAL | Status: AC
Start: 2013-01-11 — End: 2013-01-20

## 2013-01-11 MED ORDER — SUMATRIPTAN 20 MG/ACT NA SOLN
NASAL | Status: DC
Start: 2013-01-11 — End: 2014-04-17

## 2013-01-11 MED ORDER — FLUCONAZOLE 150 MG OR TABS
ORAL_TABLET | ORAL | Status: AC
Start: 2013-01-11 — End: 2013-01-12

## 2013-01-11 NOTE — Patient Instructions (Signed)
Please start antibiotic for infected bug bites.    Please make appointment with neurology for discussion of migraine management.    Follow up if symptoms not improving or worsening in any way.

## 2013-01-11 NOTE — Progress Notes (Signed)
Why is patient here today? Patient is here for bug bites since Sunday.   Thinks that they are spider bites.   Feeling dizzy and nauseated.
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
## 2013-01-19 ENCOUNTER — Ambulatory Visit (HOSPITAL_BASED_OUTPATIENT_CLINIC_OR_DEPARTMENT_OTHER): Admit: 2013-01-19 | Discharge: 2013-01-19 | Disposition: A | Discharge status: Home / Self Care | Payer: Self-pay

## 2013-02-21 ENCOUNTER — Ambulatory Visit (INDEPENDENT_AMBULATORY_CARE_PROVIDER_SITE_OTHER): Payer: No Typology Code available for payment source | Admitting: Neurology

## 2013-02-21 ENCOUNTER — Encounter (INDEPENDENT_AMBULATORY_CARE_PROVIDER_SITE_OTHER): Payer: Self-pay | Admitting: Neurology

## 2013-02-21 VITALS — BP 132/81 | HR 84 | Wt 170.0 lb

## 2013-02-21 NOTE — Patient Instructions (Signed)
Please add Feverfew 380 mg once a day and vitamin B 2 400 mg to your daily treatment regiment to prevent migraines.    Do not take Sumatriptan, Naproxen or Ibuprofen more frequently than 3 days a week, they may cause rebound headaches.    If you used recommended abortive medications and migraine still persists or recurs, try Petadolex fast acting 2 capsules or 100 mg.  Petadolex can be used daily.
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
## 2013-03-13 ENCOUNTER — Encounter (INDEPENDENT_AMBULATORY_CARE_PROVIDER_SITE_OTHER): Payer: Self-pay | Admitting: Neurology

## 2013-03-13 ENCOUNTER — Ambulatory Visit (INDEPENDENT_AMBULATORY_CARE_PROVIDER_SITE_OTHER): Payer: No Typology Code available for payment source | Admitting: Neurology

## 2013-03-13 VITALS — BP 108/80 | HR 78 | Ht 62.25 in | Wt 174.0 lb

## 2013-03-13 NOTE — Patient Instructions (Signed)
Consider taking B2 400 mg a day or Coenzyme Q 10 200 mg a day for headaches prevention.    If there is no improvement  In 6 weeks, please follow up to discuss treatment with Lamotrigine
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
## 2013-03-31 ENCOUNTER — Other Ambulatory Visit: Payer: Self-pay | Admitting: Pharmacist

## 2013-03-31 MED ORDER — NECON 0.5/35 (28) 0.5-35 MG-MCG OR TABS
1.0000 | ORAL_TABLET | Freq: Every day | ORAL | Status: DC
Start: 2013-03-31 — End: 2013-04-05

## 2013-03-31 NOTE — Telephone Encounter (Signed)
Per 04/06/12:  On oral contraceptive pill x 20 years. Sexually active with husband only. Takes continuous OCPs, no menses. Has had spotting in the last couple of months. Has taken preg test recently, neg.    Patient last seen on 01/11/13

## 2013-04-04 ENCOUNTER — Other Ambulatory Visit: Payer: Self-pay | Admitting: Pharmacist

## 2013-04-04 NOTE — Telephone Encounter (Signed)
Necon is currently backordered per [facility] pharmacy fax, rx is written DAW1.      Ok to change to generic or change to another medication?

## 2013-04-04 NOTE — Telephone Encounter (Signed)
Reviewed, defer to pcp / prescribing provider

## 2013-04-05 MED ORDER — NORETHINDRONE-ETH ESTRADIOL 0.5-35 MG-MCG OR TABS
1.0000 | ORAL_TABLET | Freq: Every day | ORAL | Status: DC
Start: 2013-04-05 — End: 2013-12-16

## 2013-04-05 NOTE — Telephone Encounter (Signed)
Per 04/06/12:  On oral contraceptive pill x 20 years. Sexually active with husband only. Takes continuous OCPs, no menses. Has had spotting in the last couple of months. Has taken preg test recently, neg.    Patient last seen on 01/11/13

## 2013-04-05 NOTE — Telephone Encounter (Signed)
Ok to change to generic

## 2013-06-01 ENCOUNTER — Other Ambulatory Visit: Payer: Self-pay

## 2013-07-26 ENCOUNTER — Telehealth (INDEPENDENT_AMBULATORY_CARE_PROVIDER_SITE_OTHER): Payer: Self-pay | Admitting: Family Medicine

## 2013-07-26 NOTE — Telephone Encounter (Signed)
Initial Telephone Call Documentation     Patient Complaint (use patient's exact words):  "Coughing. Can't sleep. Throbbing headache, body aches. We were travelling, flying home from [facility] and I've been since since then - since Sunday. Low grade fever. Headache - taking ibuprofen which helps a little. I just want to be able to sleep. Head is hurting and off and on stomach hurts. Appetite is decreased. But still able to eat and drink. Staying hydrated. Taking OTC: decongestant - doesn't help. Snot is NOT green, so I know it's not an infection."   When did it start?  Sunday    Does anything make the symptom worse (if Yes - list activity/action & result)?  No   Rate your pain or discomfort on a 0 -10 scale:     Quality of pain:  pressure-like   Have you tried anything to relieve the symptom(s) (if Yes - list action & result)?   Yes, Ibuprofen, an OTC decongestant. Ibuprofen helps with the headache a little, but the decongestant is not helpful at all     Protocol used and page#:  Common Cold Symptoms, pg 133  Negative per protocol: Section A: No symptoms present, Section B:  No symptoms present and Section C: No symptoms present  Positive per protocol:  Section D:  Sore throat, ear pain, sinus pain >24 hours  Protocol recommended disposition:  See provider within 24 hours    Caller agrees:  YES  Plan:  Patient will come to [facility] today or tomorrow. Hours provided.     Instructed to call back or seek treatment if symptoms worsen or has new concerns:  YES  Caller understands plan:  YES  Reference used:  Eliot [employer] 4th edition, Telephone Triage Protocols for Nurses    Tarri Fuller, 07/26/2013 1:54 PM

## 2013-07-26 NOTE — Telephone Encounter (Signed)
CONFIRMED PHONE NUMBER: [phone]  CALLERS FIRST AND LAST NAME: Madhuri Vacca  FACILITY NAME: na TITLE: na  CALLERS RELATIONSHIP:Self  RETURN CALL: General message OK     SUBJECT: General Message   REASON FOR REQUEST: Talk to nurse    MESSAGE: Patient would like to know if she needs to come in for a visit. She's experiencing cold/flu symptoms since Sunday. Please assist, thank you.

## 2013-07-27 ENCOUNTER — Ambulatory Visit (INDEPENDENT_AMBULATORY_CARE_PROVIDER_SITE_OTHER): Payer: No Typology Code available for payment source | Admitting: Family Medicine

## 2013-07-27 ENCOUNTER — Encounter (INDEPENDENT_AMBULATORY_CARE_PROVIDER_SITE_OTHER): Payer: Self-pay | Admitting: Family Medicine

## 2013-07-27 VITALS — BP 127/81 | HR 81 | Temp 98.7°F | Resp 14 | Wt 183.2 lb

## 2013-07-27 DIAGNOSIS — J309 Allergic rhinitis, unspecified: Secondary | ICD-10-CM

## 2013-07-27 DIAGNOSIS — J40 Bronchitis, not specified as acute or chronic: Secondary | ICD-10-CM

## 2013-07-27 DIAGNOSIS — J329 Chronic sinusitis, unspecified: Secondary | ICD-10-CM

## 2013-07-27 MED ORDER — AMOXICILLIN-POT CLAVULANATE 875-125 MG OR TABS
1.0000 | ORAL_TABLET | Freq: Two times a day (BID) | ORAL | Status: AC
Start: 2013-07-27 — End: 2013-08-06

## 2013-07-27 MED ORDER — FLUTICASONE PROPIONATE 50 MCG/ACT NA SUSP
2.0000 | Freq: Every day | NASAL | Status: DC
Start: 2013-07-27 — End: 2013-12-27

## 2013-07-27 MED ORDER — FLUCONAZOLE 150 MG OR TABS
ORAL_TABLET | ORAL | Status: DC
Start: 2013-07-27 — End: 2013-12-27

## 2013-07-27 NOTE — Progress Notes (Addendum)
Christina Mathews is a 43 year old female who presents to the [facility] with a URI   43 year old female here with her husband today because of a 5 day history of head and chest congestion. They were in [place] when she became sick with some nasal congestion and a sore throat. She had to fly back the following day and she's developed persistent sinus congestion and pressure. She's had some discomfort in both ears but has not had a fever. She has cough on inspiration and has a remote history of sinus infections along with sinus surgery in the past. She's been out of work now for several days. Her husband states he had a very mild cold at the same time that cleared prior to their flight home. She also states that she usually gets a yeast infection with antibiotics. She is not currently using her sumatriptan or cyclobenzaprine but uses them both on p.r.n. basis. She has been using some Nettie pot irrigation and has also been using cetirizine which she uses year-round due to chronic allergies. Denies any nausea or vomiting. She's not had a high fever with this. She does have an expiratory cough.        History   Substance Use Topics   . Smoking status: Never Smoker    . Smokeless tobacco: Never Used   . Alcohol Use: Yes      Comment: once per month, if that; none, 04/06/12       Outpatient Prescriptions Prior to Visit   Medication Sig Dispense Refill   . Calcium Carbonate-Vitamin D (CALCIUM + D OR) None Entered       . Cetirizine HCl (ZYRTEC) 10 MG Oral Tab 1 TABLET DAILY       . Cyclobenzaprine HCl (FLEXERIL) 10 MG Oral Tab Take 1 tablet by mouth 3 times per day as needed for muscle pain  60 Tab  1   . Multiple Vitamin (MULTI-VITAMIN OR) once daily       . Naproxen 500 MG Oral Tab Take 1 tablet by mouth 2 times per day as needed at the first sign of migraine  30 Tab  3   . Norethindrone-Eth Estradiol (NECON 0.5/35, 28,) 0.5-35 MG-MCG Oral Tab Take 1 tablet by mouth daily.  4 packet   2   . QUERCETIN OR for allergies       . SUMAtriptan 20 MG/ACT Nasal Solution 1 spray into one nostril only at onset of headache.  May repeat 1 time in 2 hours if needed.  12 Inhaler  4     No facility-administered medications prior to visit.     There are no discontinued medications.      MEDS REVIEWED AND ADJUSTMENTS MADE IN LIST BEFORE DISCHARGE.    Review of patient's allergies indicates:  Allergies   Allergen Reactions   . Acephen (Acetaminophen) Nausea/Vomiting       Past Surgical History   Procedure Laterality Date   . Tonsillectomy & adenoidectomy <age 54  1989   . Open repair of rotator cuff acute  1997     right shoulder   . Unlisted procedure accessory sinuses  1993       Parts of his medical record are completed using a dragon dictation system. I have tried to catch any possible errors as they were dictated but some may still exists as they're often inherent interpretive errors in the system.        Review of Systems  Constitutional: Positive for activity change, appetite change and fatigue. Negative for fever and chills.   HENT: Positive for hearing loss, ear pain, congestion, sore throat, postnasal drip and sinus pressure. Negative for sneezing and ear discharge.    Eyes: Negative for redness.   Respiratory: Positive for cough. Negative for shortness of breath.    Cardiovascular: Negative for chest pain.   Gastrointestinal: Negative for nausea, abdominal pain and diarrhea.   Skin: Negative for rash.   Allergic/Immunologic: Positive for environmental allergies.   Neurological: Negative for weakness and headaches.   Psychiatric/Behavioral: Positive for sleep disturbance.       Physical Exam   Nursing note and vitals reviewed.  Constitutional: She is oriented to person, place, and time. She appears well-developed and well-nourished. She appears distressed (fatigued appearance).   HENT:   Head: Normocephalic and atraumatic.   Right Ear: Tympanic membrane, external ear and ear canal normal.   Left Ear:  External ear and ear canal normal.   TM on the left has a dull appearance without erythema.   Eyes: Conjunctivae are normal.   Neck: Normal range of motion. Neck supple.   Cardiovascular: Normal rate, regular rhythm and normal heart sounds.    Pulmonary/Chest: Effort normal and breath sounds normal. No respiratory distress.   Notable cough on inspiration   Musculoskeletal: Normal range of motion.   Lymphadenopathy:     She has cervical adenopathy.   Neurological: She is alert and oriented to person, place, and time.   Skin: Skin is warm and dry.   Psychiatric: She has a normal mood and affect. Her behavior is normal. Judgment normal.     Seems likely given her history and persistent symptoms that she will end up needing antibiotics either for bronchitis or more likely sinusitis.  Now at 5 days she is failing to improve.  Therefore we have given her the tools she may need to use if she fails to improve.    (473.9) Sinusitis  (primary encounter diagnosis)  Plan: Fluticasone Propionate 50 MCG/ACT Nasal         Suspension, Amoxicillin-Pot Clavulanate         (AUGMENTIN) 875-125 MG Oral Tab, Fluconazole         150 MG Oral Tab            (490) Bronchitis  Plan: Amoxicillin-Pot Clavulanate (AUGMENTIN) 875-125        MG Oral Tab            (477.9) ALLERGIC RHINITIS NOS  Plan: Add fluticasone daily.    Acute Sinusitis Care Instructions     Acute Sinusitis means symptoms of a sinus inflammation (pain, congestion, drainage) lasting less than 4 weeks. Studies have shown that patients with this problem benefit from sinus drainage and decompression and do not benefit from antibiotics. Antibiotics have only been shown to be beneficial in people who have had symptoms of sinus congestion for longer than 4-6 weeks. Patients who receive antibiotics are at risk of developing side effects, such as reactions to the medications in the form of acute allergic reactions, diarrhea, yeast and other secondary infections. If an allergy to the  antibiotic develops, it may prevent the usage of this family of antibiotics in the future, when you may really need them.     In order to relieve the discomfort and prevent a serious infection from developing, we need to get the sinuses open to allow drainage and relief of pressure. Here is a list of things that  will help you do this.     1) Add Humidity which helps shrink the nasal membranes and helps open the sinus openings. It also helps thin out secretions. This can be accomplished in many ways.       a) Breathing in steam off of a kettle, vaporizer or humidifier.       b) Using mask (surgeons or painters) helps trap moisture from your breath in the membrane and this moisture helps humidify the air when you inhale. The same thing can be accomplished at night by breathing though a single sheet. Breathe through the sheet, not under the covers.       c) Nasal saline sprays will add moisture and also washes the membranes off. It also help thin the mucous making it easier for your sinuses to drain. You should use this about 6-8 times a day.     2) Shrink the nasal membranes by elevating the head. Add an extra pillow at night which helps decrease pressure in the facial tissues and lessen nasal congestion.     3) Avoid antihistamines that can dry or thicken up secretions making it more difficult for them to drain through the very small sinus openings.     4) Avoid flying, which can cause significant pain due to pressure.     5) Be aware of things that dry out the air such as:       a. Wood stoves, and other heating sources       b. Air conditioners       c. Airplane air (wear a mask)       d. Cold air in the winter is also very drying     6) Medications:       a) Mucinex, which is over the counter, does thin the mucous and this allows better drainage and helps decrease pressure. It should be taken twice daily and preferably the plain Mucinex without decongestants or cough suppressants. It also thins out bronchial  secretions and helps you bring up any phlegm easier.       b) Increasing fluids, especially hot liquids, decreases the thickness of the mucous and again improves drainage of the sinuses.       c) The use of Tylenol or Advil will also help relieve pain and pressure.       d) Oral decongestants may have some benefit, but may also raise Blood Pressure, add anxiety, increase heart rate and risk of arrhythmias, make it more difficult to sleep and in older men can sometimes cause urinary difficulties to the point of needing a catheter.       e) Nasal Decongestant sprays can be very effective if used properly. Here are a few points to improve effectiveness and decrease problems.           1. Use a 12 hour Afrin Pump Sprayer that provides a better measured amount of spray (and a finer mist also).           2. Gently blow your nose first, or use steam to shrink membranes. This will allow the spray to travel further and coat better.           3. Spray one nostril at a time (one or two sprays) and gently sniff the spray up until you feel it at the back of your throat (you may sense a tingle, burn or taste). Gently blow it back out so as to minimize the amount in your throat. If the  medicine gets to the back of your throat it will give you an irritated, dry or raw sensation in your throat. After you blow it back out, repeat the same process on the other nostril.           4. Do not use more than twice a day, and never more than 4-5 days or your nose will get used to the medicine and remain stuffy without it.           5. Wait 15-20 minutes before using saline sprays, but continue to use the saline.   If fever develops or headache persists despite the above treatments, follow up with your doctor for re-evaluation. There are other causes of sinus pain such as dental causes, migraines, allergies and other more serious problems.     Be aware that allergies may contribute to nasal swelling and other medications may be needed to get  your sinuses draining.     Use the fluticasone spray as directed  If you fail to improve over the next 24-48 hours start the antibiotic

## 2013-07-27 NOTE — Patient Instructions (Signed)
Acute Sinusitis Care Instructions     Acute Sinusitis means symptoms of a sinus inflammation (pain, congestion, drainage) lasting less than 4 weeks. Studies have shown that patients with this problem benefit from sinus drainage and decompression and do not benefit from antibiotics. Antibiotics have only been shown to be beneficial in people who have had symptoms of sinus congestion for longer than 4-6 weeks. Patients who receive antibiotics are at risk of developing side effects, such as reactions to the medications in the form of acute allergic reactions, diarrhea, yeast and other secondary infections. If an allergy to the antibiotic develops, it may prevent the usage of this family of antibiotics in the future, when you may really need them.     In order to relieve the discomfort and prevent a serious infection from developing, we need to get the sinuses open to allow drainage and relief of pressure. Here is a list of things that will help you do this.     1) Add Humidity which helps shrink the nasal membranes and helps open the sinus openings. It also helps thin out secretions. This can be accomplished in many ways.       a) Breathing in steam off of a kettle, vaporizer or humidifier.       b) Using mask (surgeons or painters) helps trap moisture from your breath in the membrane and this moisture helps humidify the air when you inhale. The same thing can be accomplished at night by breathing though a single sheet. Breathe through the sheet, not under the covers.       c) Nasal saline sprays will add moisture and also washes the membranes off. It also help thin the mucous making it easier for your sinuses to drain. You should use this about 6-8 times a day.     2) Shrink the nasal membranes by elevating the head. Add an extra pillow at night which helps decrease pressure in the facial tissues and lessen nasal congestion.     3) Avoid antihistamines that can dry or thicken up secretions making it more difficult for  them to drain through the very small sinus openings.     4) Avoid flying, which can cause significant pain due to pressure.     5) Be aware of things that dry out the air such as:       a. Wood stoves, and other heating sources       b. Air conditioners       c. Airplane air (wear a mask)       d. Cold air in the winter is also very drying     6) Medications:       a) Mucinex, which is over the counter, does thin the mucous and this allows better drainage and helps decrease pressure. It should be taken twice daily and preferably the plain Mucinex without decongestants or cough suppressants. It also thins out bronchial secretions and helps you bring up any phlegm easier.       b) Increasing fluids, especially hot liquids, decreases the thickness of the mucous and again improves drainage of the sinuses.       c) The use of Tylenol or Advil will also help relieve pain and pressure.       d) Oral decongestants may have some benefit, but may also raise Blood Pressure, add anxiety, increase heart rate and risk of arrhythmias, make it more difficult to sleep and in older men can sometimes cause urinary difficulties  to the point of needing a catheter.       e) Nasal Decongestant sprays can be very effective if used properly. Here are a few points to improve effectiveness and decrease problems.           1. Use a 12 hour Afrin Pump Sprayer that provides a better measured amount of spray (and a finer mist also).           2. Gently blow your nose first, or use steam to shrink membranes. This will allow the spray to travel further and coat better.           3. Spray one nostril at a time (one or two sprays) and gently sniff the spray up until you feel it at the back of your throat (you may sense a tingle, burn or taste). Gently blow it back out so as to minimize the amount in your throat. If the medicine gets to the back of your throat it will give you an irritated, dry or raw sensation in your throat. After you blow it back  out, repeat the same process on the other nostril.           4. Do not use more than twice a day, and never more than 4-5 days or your nose will get used to the medicine and remain stuffy without it.           5. Wait 15-20 minutes before using saline sprays, but continue to use the saline.   If fever develops or headache persists despite the above treatments, follow up with your doctor for re-evaluation. There are other causes of sinus pain such as dental causes, migraines, allergies and other more serious problems.     Be aware that allergies may contribute to nasal swelling and other medications may be needed to get your sinuses draining.     Use the fluticasone spray as directed  If you fail to improve over the next 24-48 hours start the antibiotic

## 2013-08-17 ENCOUNTER — Other Ambulatory Visit: Payer: Self-pay | Admitting: Family Medicine

## 2013-08-17 DIAGNOSIS — G43909 Migraine, unspecified, not intractable, without status migrainosus: Secondary | ICD-10-CM

## 2013-08-17 MED ORDER — NAPROXEN 500 MG OR TABS
ORAL_TABLET | ORAL | Status: DC
Start: 2013-08-17 — End: 2014-05-16

## 2013-08-17 NOTE — Telephone Encounter (Signed)
Prescription refill sent.

## 2013-11-22 ENCOUNTER — Ambulatory Visit (INDEPENDENT_AMBULATORY_CARE_PROVIDER_SITE_OTHER): Payer: No Typology Code available for payment source | Admitting: Family Medicine

## 2013-11-22 ENCOUNTER — Encounter (INDEPENDENT_AMBULATORY_CARE_PROVIDER_SITE_OTHER): Payer: Self-pay | Admitting: Family Medicine

## 2013-11-22 ENCOUNTER — Other Ambulatory Visit (INDEPENDENT_AMBULATORY_CARE_PROVIDER_SITE_OTHER): Payer: Self-pay | Admitting: Family Medicine

## 2013-11-22 ENCOUNTER — Other Ambulatory Visit (HOSPITAL_BASED_OUTPATIENT_CLINIC_OR_DEPARTMENT_OTHER)
Admit: 2013-11-22 | Discharge: 2013-11-22 | Disposition: A | Discharge status: Home / Self Care | Payer: No Typology Code available for payment source | Attending: Family Medicine | Admitting: Family Medicine

## 2013-11-22 VITALS — BP 116/79 | HR 73 | Temp 98.4°F | Wt 183.0 lb

## 2013-11-22 DIAGNOSIS — E041 Nontoxic single thyroid nodule: Secondary | ICD-10-CM

## 2013-11-22 DIAGNOSIS — G43909 Migraine, unspecified, not intractable, without status migrainosus: Secondary | ICD-10-CM

## 2013-11-22 DIAGNOSIS — Z13220 Encounter for screening for lipoid disorders: Secondary | ICD-10-CM

## 2013-11-22 DIAGNOSIS — Z1329 Encounter for screening for other suspected endocrine disorder: Secondary | ICD-10-CM

## 2013-11-22 DIAGNOSIS — Z87410 Personal history of cervical dysplasia: Secondary | ICD-10-CM

## 2013-11-22 DIAGNOSIS — Z01419 Encounter for gynecological examination (general) (routine) without abnormal findings: Secondary | ICD-10-CM

## 2013-11-22 DIAGNOSIS — Z23 Encounter for immunization: Secondary | ICD-10-CM

## 2013-11-22 DIAGNOSIS — Z1151 Encounter for screening for human papillomavirus (HPV): Secondary | ICD-10-CM | POA: Insufficient documentation

## 2013-11-22 DIAGNOSIS — Z131 Encounter for screening for diabetes mellitus: Secondary | ICD-10-CM

## 2013-11-22 NOTE — Progress Notes (Signed)
Why is patient here today? Patient is being seen for wellness exam, also would like lab work done, also would like to discuss if she is still able to have children  Does patient need refills? no  Does patient need a referral? no  Does patient need a letter or need forms filled out? no  Lab results? no  Please check health mainenance issues and flag for the doctor:  Pap, Tetanus, Requesting Pap from [place]    Vaccine Screening Questions      1.  Have you had a serious reaction or an allergic reaction to a vaccine?  NO    2.  Currently have a moderate or severe illness, including fever? (Don't Ask if vaccine   ordered by provider same day)  NO    3.  Ever had a seizure or a brain or other nervous system problem syndrome associated with a vaccine? (DTaP/TDaP/DTP pertinent) NO    4.  Is patient receiving  any live vaccinations today? (Varicella-Chickenpox, MMR-Measles/Mumps/Rubella, Zoster-Shingles)  NO    If YES to any of the questions above - Do NOT give vaccine.  Consult with RN or provider in clinic.  (#4 can be YES if all Live vaccine questions are answered NO)    If NO to all questions above - Patient may receive vaccine.    5.  Do you need to receive the Flu vaccine today? NO    Vaccine information sheet(s) discussed, patient/parent/guardian verbalized understanding? YES     VIS given 11/22/2013 by Sharen Heck MA.      [place]

## 2013-11-22 NOTE — Patient Instructions (Addendum)
Please make appointment with Dr Roxan Hockeyobinson ASAP for discussion of migraine medication that you can potentially take instead of oral contraceptive pills for prevention and what you can do for migraines when you are pregnant.    Please continue prenatal vitamin.    I will email you with lab and pap results.    Please make appointment for thyroid ultrasound

## 2013-11-22 NOTE — Progress Notes (Signed)
Christina Mathews is a 43 year old female here today for a preventive health visit.   Other problems or concerns today:   Migraines:  Has been on continuous OCPs since her 420s which has really helped her with her migraines.  Saw Dr Roxan Hockeyobinson in 8/14, recommended feverfew, which she wasn't able to tolerate due to her seasonal allergies to weeds.    GYN HISTORY  Obstetric History     No data available     No LMP recorded. Patient is not currently having periods (Reason: Continuous OCP's).   Periods are rare,on continue oral contraceptive pills.    Intermc enstrual bleeding, spotting,or discharge: Yes: rarely   Last pap: 2014 at [facility], records requested.  Normal per patient.  Pap history: LEEP:   LEEP 1998 - unable to obtain records after multiple attempts.  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    Other gyn history: none    SEXUAL HISTORY  Sexual activity: yes, single partner, contraception - COCs  Sexual concerns: No  History of sexual or physical abuse: No  Have you been hit, kicked, punched, or otherwise hurt by someone within the past year:No    CANCER SCREENING  Family history of colon cancer: NO  Family history of uterine or ovarian cancer: NO  Family history of breast cancer: NO  Prior mammogram: YES 10/14, normal   History of abnormal mammogram: NO    LIFESTYLE  Current dietary habits: healthy diet in general  Calcium: dietary sources and prenatal vitamin  Current exercise habits: yes, engages in regular exercise  Substance use:  reports that she has never smoked. She has never used smokeless tobacco. She reports that she drinks alcohol. She reports that she does not use illicit drugs.      History sections of chart reviewed and updated today: YES    Review Of Systems  Weight: having trouble losing weight   Constitutional:  denies fatigue, fever, chills, sweats  Regular dental exams: yes  GI: Denies constipation, diarrhea, or blood in stools; denies nausea, dysphagia, heartburn or other abd. pain  GU: denies abnormal vaginal bleeding, discharge or unusual pelvic pain, denies dysuria, frequency or gross hematuria  Endocrine: no known diabetes or thyroid disease   Skin: denies rash or other active skin concerns  Psych: Depression symptoms: was very stressed in the last couple years with her job, much improved now that she is working 1/2 time.  Other: none    EXAM:  BP 116/79   Pulse 73   Temp(Src) 98.4 F (36.9 C) (Temporal)   Wt 183 lb (83.008 kg)   Body mass index is 33.21 kg/(m^2).  General appearance: healthy, alert, no distress  Skin: Skin color, texture, turgor normal. No rashes or concerning lesions  Neck: supple. No adenopathy. Thyroid symmetric, normal size, without nodules  Breasts: No obvious deformity or mass to inspection, nipples everted bilaterally, no skin lesion or nipple discharge, no mass palpated, no axillary lymphadenopathy  Lungs: clear to auscultation  Heart: normal rate, regular rhythm  Abdomen: soft, non-tender. BS normal. No masses or organomegaly  Pelvic: normal bartholin/skene/urethral meatus/anus., Vagina is rugated and well-estrogenized, cervix normal in appearance, no CMT, no bladder tenderness, uterus normal size, shape, and consistency, no adnexal masses or tenderness   Rectal: deferred  ASSESSMENT/PLAN:  Health Maintenance   Topic Date Due   . TETANUS BOOSTER  10/14/1984   . PAP 1 YEAR W/IB MESSAGE  10/26/2012   . INFLUENZA >9 YRS  03/27/2014   . PAP 3 YEAR W/IB MESSAGE  10/27/2014     Immunizations or studies due: Yes, see below    (V72.31) Visit for routine gyn exam  (primary encounter diagnosis)  Plan: discussed fertility, continuing prenatal vitamin, increased recommendation for prenatal diagnosis during pregnancy after age 43, normal fertility after LEEP but there could be issues with  incompetent cervix.    (241.0) Thyroid nodule  Plan: US EXAM OF HEAD AND NECK        Due for repeat u/s    (V13.22) Personal history of cervical dysplasia  Plan: PAP SMEAR (OPTIONAL HPV)        Her paps have been normal after LEEP 15 years ago.  Patient is considering conception and we agreed on pap today to assure all is normal     (346.90) MIGRAINE NOS W/O MENTN INTRACTABLE  Plan: advised follow up with Dr Roxan Hockeyobinson as she is going to go off oral contraceptive pills and her migraines have become quite bad with discontinuation in the past, and she will not be able to take triptans during pregnancy    (V77.0) Screening for thyroid disorder  Plan: THYROID STIMULATING HORMONE            (V77.1) Screening for diabetes mellitus  Plan: GLUCOSE SERUM, FASTING            (V77.91) Screening, lipid  Plan: LIPID PANEL              Preventive counseling: health maintenance  low-fat high-fiber diet  proper exercise  Follow-up: prn  Lab tests and results of any diagnostic studies will be shared with the patient by  eCare

## 2013-11-23 LAB — LIPID PANEL
Cholesterol (LDL): 72 mg/dL (ref <130)
Cholesterol/HDL Ratio: 2.2
HDL Cholesterol: 68 mg/dL (ref >40)
Non-HDL Cholesterol: 84 mg/dL (ref 0–159)
Total Cholesterol: 152 mg/dL (ref <200)
Triglyceride: 58 mg/dL (ref <150)

## 2013-11-23 LAB — GLUCOSE, FASTING: Glucose, Fasting: 65 mg/dL (ref 62–125)

## 2013-11-23 LAB — THYROID STIMULATING HORMONE: Thyroid Stimulating Hormone: 2.062 u[IU]/mL (ref 0.400–5.000)

## 2013-11-27 LAB — HPV ONLY

## 2013-11-27 LAB — CERVICAL CANCER SCREENING: Cytologic Impression: NEGATIVE

## 2013-11-28 ENCOUNTER — Encounter (INDEPENDENT_AMBULATORY_CARE_PROVIDER_SITE_OTHER): Payer: Self-pay | Admitting: Family Medicine

## 2013-12-05 ENCOUNTER — Encounter (INDEPENDENT_AMBULATORY_CARE_PROVIDER_SITE_OTHER): Payer: Self-pay

## 2013-12-16 ENCOUNTER — Other Ambulatory Visit (INDEPENDENT_AMBULATORY_CARE_PROVIDER_SITE_OTHER): Payer: Self-pay | Admitting: Family Medicine

## 2013-12-16 DIAGNOSIS — IMO0001 Reserved for inherently not codable concepts without codable children: Secondary | ICD-10-CM

## 2013-12-19 MED ORDER — NECON 0.5/35 (28) 0.5-35 MG-MCG OR TABS
ORAL_TABLET | ORAL | Status: DC
Start: 2013-12-19 — End: 2015-02-27

## 2013-12-19 NOTE — Telephone Encounter (Signed)
Wellness 11/22/13  PAP WNL 11/22/13

## 2013-12-20 ENCOUNTER — Encounter (INDEPENDENT_AMBULATORY_CARE_PROVIDER_SITE_OTHER): Payer: Self-pay | Admitting: Family Medicine

## 2013-12-20 ENCOUNTER — Telehealth (INDEPENDENT_AMBULATORY_CARE_PROVIDER_SITE_OTHER): Payer: Self-pay | Admitting: Family Medicine

## 2013-12-20 NOTE — Telephone Encounter (Signed)
left nonspecific message on her cell phone to call back with results.  When she calls, please let patient know that the thyroid nodule appears stable.  There is a right sided lymph node in the neck that is just slightly increased in size from last ultrasound.  If she is noting swelling in the neck, painful lymph node, then she should come in for evaluation.  Thank you.

## 2013-12-20 NOTE — Telephone Encounter (Signed)
Relayed message to patient.     She is scheduled to come in next week    Closing TE

## 2013-12-20 NOTE — Telephone Encounter (Signed)
CONFIRMED PHONE NUMBER:     Telephone Information:   Mobile [phone]       CALLERS FIRST AND LAST NAME: Karesha Weckman    FACILITY NAME: na TITLE: na  CALLERS RELATIONSHIP:Self  RETURN CALL: Detailed message on voicemail only     SUBJECT: General Message   REASON FOR REQUEST: returning call     MESSAGE: Patient is returning call for PCP, advised of message in TE- patient is experiencing some symptoms and we did schedule an appointment to discuss. Please call and assist patient, please leave detailed message if no answer. Thank you!

## 2013-12-20 NOTE — Telephone Encounter (Signed)
Duplicate TE  Closing TE

## 2013-12-27 ENCOUNTER — Encounter (INDEPENDENT_AMBULATORY_CARE_PROVIDER_SITE_OTHER): Payer: Self-pay | Admitting: Family Medicine

## 2013-12-27 ENCOUNTER — Other Ambulatory Visit (INDEPENDENT_AMBULATORY_CARE_PROVIDER_SITE_OTHER): Payer: No Typology Code available for payment source

## 2013-12-27 ENCOUNTER — Ambulatory Visit (INDEPENDENT_AMBULATORY_CARE_PROVIDER_SITE_OTHER): Payer: No Typology Code available for payment source | Admitting: Family Medicine

## 2013-12-27 VITALS — BP 120/77 | HR 67 | Temp 98.2°F | Wt 181.0 lb

## 2013-12-27 DIAGNOSIS — R011 Cardiac murmur, unspecified: Secondary | ICD-10-CM

## 2013-12-27 DIAGNOSIS — R5383 Other fatigue: Secondary | ICD-10-CM

## 2013-12-27 DIAGNOSIS — R5381 Other malaise: Secondary | ICD-10-CM

## 2013-12-27 DIAGNOSIS — R599 Enlarged lymph nodes, unspecified: Secondary | ICD-10-CM

## 2013-12-27 DIAGNOSIS — R590 Localized enlarged lymph nodes: Secondary | ICD-10-CM

## 2013-12-27 DIAGNOSIS — M7989 Other specified soft tissue disorders: Secondary | ICD-10-CM

## 2013-12-27 NOTE — Progress Notes (Signed)
Why is patient here today? Patient is being seen for swelling in neck, has been swollen for awhile, sometimes painful, very annoying  Does patient need refills? no  Does patient need a referral? no  Does patient need a letter or need forms filled out? no  Lab results? no  Please check health mainenance issues and flag for the doctor:  UTD
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
## 2013-12-27 NOTE — Progress Notes (Signed)
Lab draw

## 2013-12-27 NOTE — Patient Instructions (Signed)
Please make appointment for echocardiogram and with Dr Roxan Hockey.    I will email you with results.    Please let me know if you cannot get in to see Dr Roxan Hockey in the next 4-6 weeks.

## 2013-12-28 LAB — CBC, DIFF
% Basophils: 0 %
% Eosinophils: 1 %
% Immature Granulocytes: 0 %
% Lymphocytes: 32 %
% Monocytes: 6 %
% Neutrophils: 61 %
Absolute Eosinophil Count: 0.08 10*3/uL (ref 0.00–0.50)
Absolute Lymphocyte Count: 2.93 10*3/uL (ref 1.00–4.80)
Basophils: 0.02 10*3/uL (ref 0.00–0.20)
Hematocrit: 44 % (ref 36–45)
Hemoglobin: 14.8 g/dL (ref 11.5–15.5)
Immature Granulocytes: 0.01 10*3/uL (ref 0.00–0.05)
MCH: 30.4 pg (ref 27.3–33.6)
MCHC: 34 g/dL (ref 32.2–36.5)
MCV: 89 fL (ref 81–98)
Monocytes: 0.56 10*3/uL (ref 0.00–0.80)
Neutrophils: 5.48 10*3/uL (ref 1.80–7.00)
Platelet Count: 268 10*3/uL (ref 150–400)
RBC: 4.87 mil/uL (ref 3.80–5.00)
RDW-CV: 12.9 % (ref 11.6–14.4)
WBC: 9.08 10*3/uL (ref 4.3–10.0)

## 2013-12-28 LAB — BASIC METABOLIC PANEL
Anion Gap: 6 (ref 4–12)
Calcium: 9.3 mg/dL (ref 8.9–10.2)
Carbon Dioxide, Total: 29 mEq/L (ref 22–32)
Chloride: 102 mEq/L (ref 98–108)
Creatinine: 0.92 mg/dL (ref 0.38–1.02)
GFR, Calc, African American: >60 mL/min (ref >59)
GFR, Calc, European American: >60 mL/min (ref >59)
Glucose: 83 mg/dL (ref 62–125)
Potassium: 4.3 mEq/L (ref 3.6–5.2)
Sodium: 137 mEq/L (ref 135–145)
Urea Nitrogen: 8 mg/dL (ref 8–21)

## 2013-12-28 LAB — HIV ANTIGEN AND ANTIBODY SCRN
HIV Antigen and Antibody Interpretation: NONREACTIVE
HIV Antigen and Antibody Result: NONREACTIVE

## 2013-12-28 LAB — C_REACTIVE PROTEIN: C_Reactive Protein: 13.2 mg/L — ABNORMAL HIGH (ref 0.0–10.0)

## 2013-12-28 LAB — SED RATE: Erythrocyte Sedimentation Rate: 4 mm/hr (ref 0–20)

## 2014-01-19 ENCOUNTER — Other Ambulatory Visit (HOSPITAL_BASED_OUTPATIENT_CLINIC_OR_DEPARTMENT_OTHER): Payer: No Typology Code available for payment source

## 2014-01-22 ENCOUNTER — Encounter (INDEPENDENT_AMBULATORY_CARE_PROVIDER_SITE_OTHER): Payer: Self-pay | Admitting: Family Medicine

## 2014-01-22 DIAGNOSIS — R590 Localized enlarged lymph nodes: Secondary | ICD-10-CM

## 2014-01-22 DIAGNOSIS — R7982 Elevated C-reactive protein (CRP): Secondary | ICD-10-CM

## 2014-01-22 NOTE — Telephone Encounter (Signed)
Please see ecare

## 2014-01-25 ENCOUNTER — Telehealth (INDEPENDENT_AMBULATORY_CARE_PROVIDER_SITE_OTHER): Payer: Self-pay | Admitting: Family Medicine

## 2014-01-25 NOTE — Telephone Encounter (Signed)
Referral to rheumatology was denied. Please review & advise.     "Dr. Lynnea FerrierSolomon has reviewed this referral and he states that a Rheumatologic dx is possible, but unlikely based on the available information."

## 2014-01-28 ENCOUNTER — Other Ambulatory Visit (INDEPENDENT_AMBULATORY_CARE_PROVIDER_SITE_OTHER): Payer: Self-pay | Admitting: Family Medicine

## 2014-01-28 DIAGNOSIS — M62838 Other muscle spasm: Secondary | ICD-10-CM

## 2014-01-29 NOTE — Telephone Encounter (Signed)
The medication requested requires your approval because it is not on RAC protocol: Cyclobenzaprine that was last filled on 01/03/13.    OK?  If not approved please notify patient.

## 2014-01-29 NOTE — Telephone Encounter (Signed)
Defer to pcp

## 2014-01-30 NOTE — Telephone Encounter (Signed)
I last saw patient in December 2013  Deferred to PCP

## 2014-01-31 MED ORDER — CYCLOBENZAPRINE HCL 10 MG OR TABS
ORAL_TABLET | ORAL | Status: DC
Start: 2014-01-31 — End: 2015-02-27

## 2014-01-31 NOTE — Telephone Encounter (Signed)
Noted.  Sent patient ecare message

## 2014-02-19 ENCOUNTER — Other Ambulatory Visit (HOSPITAL_COMMUNITY): Payer: Self-pay

## 2014-02-19 ENCOUNTER — Ambulatory Visit: Payer: No Typology Code available for payment source | Attending: Cardiovascular Disease

## 2014-02-19 DIAGNOSIS — M6289 Other specified disorders of muscle: Secondary | ICD-10-CM | POA: Insufficient documentation

## 2014-02-19 DIAGNOSIS — Z23 Encounter for immunization: Secondary | ICD-10-CM

## 2014-02-19 DIAGNOSIS — R011 Cardiac murmur, unspecified: Secondary | ICD-10-CM | POA: Insufficient documentation

## 2014-02-23 NOTE — Progress Notes (Signed)
Pasting echo report from [facility] under order in epic.    Barb Crane,  RN  [facility]

## 2014-02-28 ENCOUNTER — Encounter (INDEPENDENT_AMBULATORY_CARE_PROVIDER_SITE_OTHER): Payer: Self-pay | Admitting: Family Medicine

## 2014-03-21 ENCOUNTER — Telehealth (INDEPENDENT_AMBULATORY_CARE_PROVIDER_SITE_OTHER): Payer: Self-pay | Admitting: Family Medicine

## 2014-03-21 ENCOUNTER — Ambulatory Visit: Payer: Self-pay

## 2014-03-21 NOTE — Telephone Encounter (Signed)
-----   Message from Ward Chatters sent at 03/21/2014  4:44 PM PDT -----  Regarding: Triage Top Line:  Dizziness and pain on R side of rib area  >> Valinda Party [facility] 03/21/2014 04:44 PM  Triage Top Line:  Dizziness and pain on R side of rib area

## 2014-03-21 NOTE — Telephone Encounter (Signed)
CONFIRMED PHONE NUMBER: [phone]  CALLERS FIRST AND LAST NAME: Lorre Nick  FACILITY NAME: n/a TITLE: n/a  CALLERS RELATIONSHIP:Self  RETURN CALL: General message OK         SUBJECT: CCL Disposition Unclear if patient is calling 911 as advised.      Patient called with concerning symptoms.  Put patient through to the [facility], Nurse Patty advised patient to got to call 911 and not get on a plane back to [place] at this time.  Patient is currently at airport in [place].  Nurse also spoke with patient's husband and advised that he call 911.  Husband replied that he will talk to his wife about this so it is unclear at this time if patient is following nurse recommendation.     Please follow up when you are able to. Thank you!

## 2014-03-22 NOTE — Telephone Encounter (Signed)
Protocol: ABDOMINAL PAIN - UPPER-ADULT-AH  Affirmative: [1] SEVERE pain (e.g., excruciating) AND [2] present > 1 hour  Disposition of Go to ED Now suggested.

## 2014-03-22 NOTE — Telephone Encounter (Signed)
LMTCB otherwise will try again tomorrow       CCR: Okay to transfer to me at [phone] until 5

## 2014-03-22 NOTE — Telephone Encounter (Signed)
Pt in [place] airport right now, getting ready to board a plane to [place]; with her husband; has dizziness to the point of feeling like she may pass out; has abd pain Rt side UQ;     Advised to not board plane, sxs could indicate bleeding in abdomen; at very least needs to get evaluated before gets on plane for several hours; Advised 911 both pt and her husband; unclear whether pt will follow advice; husband states they will discuss it now.

## 2014-03-22 NOTE — Telephone Encounter (Signed)
LMTCB otherwise will try again after 4     Plan: follow up nurse advise     CCR: Okay to transfer to me at [phone] until 5

## 2014-03-23 NOTE — Telephone Encounter (Signed)
Patient was evaluated in [facility]. [place], [place]    They kept her overnight for evaluation.     Dx extreme dehydration and GI issues.     Patient is feeling better now.

## 2014-03-23 NOTE — Telephone Encounter (Addendum)
Records requested from [facility]     Records request sent to [phone]    Closing TE

## 2014-03-30 ENCOUNTER — Encounter (INDEPENDENT_AMBULATORY_CARE_PROVIDER_SITE_OTHER): Payer: Self-pay | Admitting: Internal Medicine

## 2014-03-30 ENCOUNTER — Telehealth (INDEPENDENT_AMBULATORY_CARE_PROVIDER_SITE_OTHER): Payer: Self-pay | Admitting: Surgery

## 2014-03-30 ENCOUNTER — Ambulatory Visit (INDEPENDENT_AMBULATORY_CARE_PROVIDER_SITE_OTHER): Payer: No Typology Code available for payment source | Admitting: Internal Medicine

## 2014-03-30 VITALS — BP 124/76 | HR 72 | Temp 98.6°F | Resp 16 | Ht 62.0 in | Wt 178.0 lb

## 2014-03-30 DIAGNOSIS — R1011 Right upper quadrant pain: Secondary | ICD-10-CM

## 2014-03-30 LAB — COMPREHENSIVE METABOLIC PANEL
ALT (GPT): 35 U/L — ABNORMAL HIGH (ref 7–33)
AST (GOT): 41 U/L — ABNORMAL HIGH (ref 9–38)
Albumin: 4.4 g/dL (ref 3.5–5.2)
Alkaline Phosphatase (Total): 70 U/L (ref 25–112)
Anion Gap: 9 (ref 4–12)
Bilirubin (Total): 0.4 mg/dL (ref 0.2–1.3)
Calcium: 9.6 mg/dL (ref 8.9–10.2)
Carbon Dioxide, Total: 27 mEq/L (ref 22–32)
Chloride: 102 mEq/L (ref 98–108)
Creatinine: 0.89 mg/dL (ref 0.38–1.02)
GFR, Calc, African American: >60 mL/min (ref >59)
GFR, Calc, European American: >60 mL/min (ref >59)
Glucose: 88 mg/dL (ref 62–125)
Potassium: 4.8 mEq/L (ref 3.6–5.2)
Protein (Total): 7.1 g/dL (ref 6.0–8.2)
Sodium: 138 mEq/L (ref 135–145)
Urea Nitrogen: 7 mg/dL — ABNORMAL LOW (ref 8–21)

## 2014-03-30 LAB — CBC, DIFF
% Basophils: 0 %
% Eosinophils: 1 %
% Immature Granulocytes: 0 %
% Lymphocytes: 25 %
% Monocytes: 6 %
% Neutrophils: 68 %
Absolute Eosinophil Count: 0.1 10*3/uL (ref 0.00–0.50)
Absolute Lymphocyte Count: 2.63 10*3/uL (ref 1.00–4.80)
Basophils: 0.02 10*3/uL (ref 0.00–0.20)
Hematocrit: 42 % (ref 36–45)
Hemoglobin: 14.3 g/dL (ref 11.5–15.5)
Immature Granulocytes: 0.02 10*3/uL (ref 0.00–0.05)
MCH: 30.4 pg (ref 27.3–33.6)
MCHC: 33.7 g/dL (ref 32.2–36.5)
MCV: 90 fL (ref 81–98)
Monocytes: 0.65 10*3/uL (ref 0.00–0.80)
Neutrophils: 7.19 10*3/uL — ABNORMAL HIGH (ref 1.80–7.00)
Platelet Count: 280 10*3/uL (ref 150–400)
RBC: 4.7 mil/uL (ref 3.80–5.00)
RDW-CV: 12.7 % (ref 11.6–14.4)
WBC: 10.61 10*3/uL — ABNORMAL HIGH (ref 4.3–10.0)

## 2014-03-30 LAB — LIPASE: Lipase: 31 U/L (ref <70)

## 2014-03-30 LAB — AMYLASE: Amylase Total: 34 U/L (ref 27–106)

## 2014-03-30 NOTE — Patient Instructions (Signed)
Thank you for choosing [facility]. It was a pleasure to see you in clinic today. Your Medical Assistant was: Kristine E Nilsen           You can schedule an appointment to see us by calling  [phone] or via eCare.     If labs were ordered today the results are expected to be available via eCare 5 days later. Otherwise, result letters are mailed 7-10 days after your tests are completed. If your physician needs to change your care based on your results, you will receive a phone call to notify you. If you haven't heard from him/her and it has been more than 10 days please give us a call.     You may receive a survey in the mail asking how your experience has been with [facility].  Your input and opinions are very important to us.  I hope you are able to take the time to complete this survey.    Thank you for choosing [facility].

## 2014-03-30 NOTE — Progress Notes (Signed)
(Please see information listed by medical assistant below which is reviewed and confirmed  today.  8:27 AM )      Christina Mathews is a 43 year old female  who presents with the following concerns:    Chief Complaint   Patient presents with   . Abdominal Pain     Pt here c/o URQ pain x 1-2 wks. Pt was out of state and had to go to an ER while on a layover in [place] due to patient becoming nauseated, and feeling faint.  Pt was then medicated and started feeling better, but she notes that URQ pain is still present.  ALso notes intermittent nausea, lightheadedness and she also notes some bloos in stool about a week ago. Dizziness and nausea is not as severe as before and it is more manageable but present.     1 week ago, when in [place], she had some diarrhea, but reports no unusual dietary changes.      She got on a plane and developed nausea, dizziness, had bloody diarrhea and right upper quadrant pain which was progressing.    When she was on the airplane, sitting, she felt lightheaded, presyncopal, was taken off the airplane.  She had persistent nausea, lightheadedness and right upper quadrant.  Airport paramedics took her to local hospital, low sodium, She reports an abdominal ultrasound showed normal  appendix.  A CT scan was also done which she reports was negative.      She is not sure what makes the pain worse.   Pain is made better by ibuprofen, naproxen and muscle relaxors, zofran helps the nausea.     Since being home, she has episodic diarrhea, no blood in the stool.  pain is right upper quadrant, feels some swollen like there is a fist or something under the rib cage.  Not worse with deep breath, She had a fever at the ER, about 100.  Her blood pressure was also slightly elevated at that time.  She reports her Schreiner blood cells were elevated.  She reports a strongly positive family history of gallbladder disease in multiple family members.  She is nulliparous.  Body mass index is greater than 30.   She is on continuous birth control pills for 15 years.        PMH, PSH, Family history, social history reviewed and amended as appropriate (please see history tabs in EMR.)    Patient Active Problem List   Diagnosis   . ALLERGIC RHINITIS NOS   . 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 CHRONIC SINUSITIS NOS   . Personal history of cervical dysplasia   . Fatigue   . Thyroid nodule       Review of Systems   Constitution: Negative for weight gain and weight loss.   HENT: Negative.    Cardiovascular: Negative.    Respiratory: Negative.    Gastrointestinal:        See history of present illness.  She did have an episode of blood which she noted on the toilet paper and in her underwear when she was ill on the airplane.   Genitourinary:        Continuous OCPs for 15 years.         PE:  CONSTITUTIONAL/GENERAL:      BP 124/76 mmHg  Pulse 72  Temp(Src) 98.6 F (37 C) (Oral)  Resp 16  Ht  (1.575 m)  Wt 178 lb (80.74 kg)  BMI 32.55 kg/m2  Appearance:  Well developed, appearing stated age and in no acute distress,   Body mass index is 32.55 kg/(m^2).   Gait and station:  Normal.      Physical Exam   Constitutional: She is oriented to person, place, and time and well-developed, well-nourished, and in no distress.   Eyes: No scleral icterus.   Neck: No thyromegaly present.   Cardiovascular: Normal rate, regular rhythm and normal heart sounds.    No murmur heard.  Pulmonary/Chest: Effort normal and breath sounds normal. No respiratory distress. She has no rales.   Abdominal: Soft. Bowel sounds are normal. She exhibits no mass. There is tenderness. There is no rebound and no guarding.   She is exquisitely tender to palpation in the right upper quadrant/epigastrium.  She reports some discomfort in the right lower quadrant.  There is no observable rebound at this time.   Musculoskeletal: She exhibits no edema.   Lymphadenopathy:     She has no cervical adenopathy.   Neurological: She is oriented to person, place, and time.  She displays normal reflexes.   Skin: Skin is warm and dry.   Psychiatric: Memory, affect and judgment normal.       [name] is a 43 year old with:    (789.01) RUQ abdominal pain  (primary encounter diagnosis)  Plan: COMPREHENSIVE METABOLIC PANEL, CBC, DIFF, [place]         EXAM, ABDOM, COMPLETE, REFERRAL TO GENERAL         SURGERY, H. PYLORI AB, IGG BY EIA, AMYLASE,         LIPASE        She is a 43 year old woman on continuous birth control pills with a body mass index of 32 who has a strongly positive family history of biliary and pancreatic disease who presents with 1 week history of severe epigastric/right upper quadrant pain.  Her illness was so ill that she had to be taken off the airplane and was evaluated at an Hospital in [place].  The results of that evaluation are unknown except we know that she had an elevated [place] blood cell count.  The patient reports she was advised that her gallbladder and appendix were normal.  Despite this report, I have a strong suspicion of biliary disease.  We will proceed with ultrasound of the right upper quadrant.  Laboratory studies will be done today evaluating for amylase lipase and H. pylori as well.  She reports the pain has radiated through to her back.  We are going into the Labor Day weekend, I hope she will be able to get an ultrasound soon.  She is advised that if she has any worsening of abdominal symptoms, any fever, she should go to the emergency department at [facility].  Referral for surgical consultation has also been placed.       This note was dictated with voice recognition software. There may be some errors despite editing.      The patient expresses understanding of the plan and agrees with it.  Greater than 50% of this visit was spent face to face with the patient in counseling and/or coordination of care, and/or discussing treatment/management options, and/or education with the patient and/or family member.  Total time spent was >  30  minutes, discussing biliary disease.  She will also need to come off her birth control pills.

## 2014-03-30 NOTE — Telephone Encounter (Signed)
US scanned into media

## 2014-03-30 NOTE — Progress Notes (Signed)
Reason for Visit: see chief complaint     Refills? NO  Referral? NO  Letter or Form? NO  Lab Results? NO    HEALTH MAINTENANCE:  Has the patient has this done since their last visit?  Cervical screening/PAP: not due  Mammo: not due  Colon Screen: not due    Have you seen a specialist since your last visit: No    Vaccines Due? No    HM Due:   Health Maintenance   Topic Date Due   . INFLUENZA VACCINE (1) 03/27/2014   . PAP 1 YEAR W/IB MESSAGE  11/23/2014   . PAP 3 YEAR W/IB MESSAGE  11/22/2016   . TETANUS BOOSTER  11/23/2023       PCP Verified?  Yes, Vickey Sages

## 2014-03-30 NOTE — Telephone Encounter (Signed)
789.01 (ICD-9-CM) - R10.11 (ICD-10-CM) - RUQ abdominal pain    Referring Dr. Army Melia  [place] to be done 03/30/14 @ [facility]  Patient on oral contraceptives with ++ family history of gallbladder disease with new right upper quadrant pain and clinical exam consistent with biliary disease  Records within [facility] @ [facility]

## 2014-04-03 ENCOUNTER — Telehealth (INDEPENDENT_AMBULATORY_CARE_PROVIDER_SITE_OTHER): Payer: Self-pay | Admitting: Family Medicine

## 2014-04-03 ENCOUNTER — Ambulatory Visit (INDEPENDENT_AMBULATORY_CARE_PROVIDER_SITE_OTHER): Payer: Self-pay | Admitting: Internal Medicine

## 2014-04-03 LAB — H PYLORI IGG ANTIBODY
H pylori IgG Antibody Comment: NEGATIVE
H pylori IgG Antibody Result: 3.9 U (ref 0.0–20.0)

## 2014-04-03 NOTE — Telephone Encounter (Signed)
Number provided is not the ER.

## 2014-04-03 NOTE — Telephone Encounter (Signed)
CONFIRMED PHONE NUMBER: [phone]  CALLERS FIRST AND LAST NAME: Betzaida Cremeens    FACILITY NAME: na TITLE: na  CALLERS RELATIONSHIP:Self  RETURN CALL: General message OK     SUBJECT: General Message   REASON FOR REQUEST: emergency room?    MESSAGE: Christina Mathews saw Dr.Roskam on Friday, and was advised to go to the emergency room if her symptoms persists.  1. Dizziness  2. Abdominal pain  3. Diarrhea  4. Nausea  5. Chills  CCR could not reach the clinic at this time  Patient requesting to speak to a nurse prior to leaving for the emergency room  Please call as soon as possible  Thank you

## 2014-04-03 NOTE — Telephone Encounter (Signed)
Spoke with Dr. Izola Price at [facility] for abdominal pain.  Having a Hydascan.  Follow-up with Dr. Cliffton Asters, surgeon, already arranged.

## 2014-04-03 NOTE — Telephone Encounter (Signed)
CONFIRMED PHONE NUMBER: [phone]  CALLERS FIRST AND LAST NAME: Lea- For Dr. Lovenia Shuck  FACILITY NAME: [facility]  TITLE: na  CALLERS RELATIONSHIP:Caregiver  RETURN CALL: General message OK     SUBJECT: General Message   REASON FOR REQUEST: Provider to Provider    MESSAGE: Lea from [facility] Emergency Department requested to have a message sent to Dr. Deliah Goody to have the doctor speak with Dr. Christianne Borrow. The patient is there now. Please assist. Thank you.

## 2014-04-03 NOTE — Telephone Encounter (Signed)
Number to [facility] ED is [phone]    Earlier, i had faxed over patient's last OV notes to ED as requested

## 2014-04-03 NOTE — Telephone Encounter (Signed)
Protocol: ABDOMINAL PAIN - FEMALE-ADULT-OH  Negative: Passed out (i.e., fainted, collapsed and was not responding)  Negative: Shock suspected (e.g., cold/pale/clammy skin, too weak to stand)  Negative: Sounds like a life-threatening emergency to the triager  Negative: Chest pain  Negative: Pain is mainly in upper abdomen  (if needed ask: "is it mainly above the belly button?")  Affirmative: SEVERE abdominal pain (e.g., excruciating)  Disposition of Go to ED Now suggested.  Patient states RUQ pain has been getting worse since 03/30/14 and rates current abdominal pain at 8/10, unable to ambulate without assistance.  Referred to [facility] hospital ER for evaluation.  Spouse to transport.

## 2014-04-04 ENCOUNTER — Encounter (INDEPENDENT_AMBULATORY_CARE_PROVIDER_SITE_OTHER): Payer: Self-pay | Admitting: Surgery

## 2014-04-04 ENCOUNTER — Ambulatory Visit (INDEPENDENT_AMBULATORY_CARE_PROVIDER_SITE_OTHER): Payer: No Typology Code available for payment source | Admitting: Surgery

## 2014-04-04 VITALS — BP 125/73 | HR 77 | Resp 16 | Ht 62.0 in | Wt 179.0 lb

## 2014-04-04 DIAGNOSIS — K828 Other specified diseases of gallbladder: Secondary | ICD-10-CM | POA: Insufficient documentation

## 2014-04-04 DIAGNOSIS — K824 Cholesterolosis of gallbladder: Secondary | ICD-10-CM
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
## 2014-04-04 NOTE — Telephone Encounter (Signed)
Closing TE

## 2014-04-06 ENCOUNTER — Encounter: Payer: Self-pay | Admitting: Surgery

## 2014-04-06 ENCOUNTER — Other Ambulatory Visit (INDEPENDENT_AMBULATORY_CARE_PROVIDER_SITE_OTHER): Payer: Self-pay | Admitting: Surgery

## 2014-04-06 DIAGNOSIS — K8021 Calculus of gallbladder without cholecystitis with obstruction: Secondary | ICD-10-CM

## 2014-04-06 DIAGNOSIS — K828 Other specified diseases of gallbladder: Secondary | ICD-10-CM

## 2014-04-09 LAB — PATHOLOGY, SURGICAL

## 2014-04-11 ENCOUNTER — Telehealth (INDEPENDENT_AMBULATORY_CARE_PROVIDER_SITE_OTHER): Payer: Self-pay

## 2014-04-11 NOTE — Telephone Encounter (Signed)
Patient is calling regarding loss of sensation in toes and scalp, having anxiety over it. Advised to take Naproxen and some benadryl and to place a heating pad on her lower back. Asked her to call back if no improvement tomorrow. She is comfortable trying these things.

## 2014-04-16 ENCOUNTER — Encounter (INDEPENDENT_AMBULATORY_CARE_PROVIDER_SITE_OTHER): Payer: No Typology Code available for payment source | Admitting: Surgery

## 2014-04-17 ENCOUNTER — Other Ambulatory Visit: Payer: Self-pay | Admitting: Family Medicine

## 2014-04-17 ENCOUNTER — Ambulatory Visit (INDEPENDENT_AMBULATORY_CARE_PROVIDER_SITE_OTHER): Payer: No Typology Code available for payment source | Admitting: Family Practice

## 2014-04-17 ENCOUNTER — Encounter (INDEPENDENT_AMBULATORY_CARE_PROVIDER_SITE_OTHER): Payer: Self-pay | Admitting: Family Practice

## 2014-04-17 ENCOUNTER — Other Ambulatory Visit (INDEPENDENT_AMBULATORY_CARE_PROVIDER_SITE_OTHER): Payer: No Typology Code available for payment source

## 2014-04-17 VITALS — BP 138/87 | HR 78 | Temp 98.5°F | Resp 18 | Ht 62.0 in | Wt 170.0 lb

## 2014-04-17 DIAGNOSIS — Z760 Encounter for issue of repeat prescription: Secondary | ICD-10-CM

## 2014-04-17 DIAGNOSIS — J029 Acute pharyngitis, unspecified: Secondary | ICD-10-CM

## 2014-04-17 DIAGNOSIS — E871 Hypo-osmolality and hyponatremia: Secondary | ICD-10-CM

## 2014-04-17 DIAGNOSIS — K5900 Constipation, unspecified: Secondary | ICD-10-CM

## 2014-04-17 MED ORDER — DEXAMETHASONE 6 MG OR TABS
6.0000 mg | ORAL_TABLET | Freq: Once | ORAL | Status: AC
Start: 2014-04-17 — End: 2014-04-17

## 2014-04-17 MED ORDER — POLYETHYLENE GLYCOL 3350 17 GM/SCOOP OR POWD
17.0000 g | Freq: Every day | ORAL | Status: DC | PRN
Start: 2014-04-17 — End: 2016-06-22

## 2014-04-17 NOTE — Progress Notes (Signed)
[place] [facility]    Urgent Care Family Medicine Note    Chief Complaint    [name] is a 43 year old year old female who presents to [facility] for   Chief Complaint   Patient presents with   . Throat Problem     Sore throat that feels swollen and hard to swallow since this morning.   . Constipation     pt has been constipation since friday.     HPI:   CC/Location: pt presents w multiple complaints. Expresses concern about "low sodium", recently hospitalized at [place], discharged yesterday w Na 131. Now c/o "swollen throat", started this AM. Also c/o constipation, last BM 4 days ago.   Onset: as above   Duration: persistent   Modifying factors: no relief w stool softeners and otc dulcolax pills  Associated S&S: mild cough, "feeling off", tolerating po diet w/o N/V/D    I personally reviewed and confirmed the ROS and past medical history in the record with the patient today.     Review of Systems  Review of Systems   Constitutional: Positive for fatigue. Negative for fever.   HENT: Positive for sore throat and trouble swallowing. Negative for congestion.    Respiratory: Positive for cough.    Gastrointestinal: Positive for constipation.   Genitourinary: Negative for difficulty urinating.   Musculoskeletal: Negative.    Neurological: Positive for light-headedness.     Physical Exam    Filed Vitals:    04/17/14 1619   BP: 138/87   Pulse: 78   Temp: 98.5 F (36.9 C)   TempSrc: Temporal   Resp: 18   Height:  (1.575 m)   Weight: 170 lb (77.111 kg)   SpO2: 100%     Physical Exam   Constitutional: She appears well-developed and well-nourished.   HENT:   Mouth/Throat: Uvula is midline and mucous membranes are normal. Posterior oropharyngeal edema present. No oropharyngeal exudate or posterior oropharyngeal erythema.   Eyes: Conjunctivae are normal. Pupils are equal, round, and reactive to light.   Neck: Neck supple.   Cardiovascular: Normal rate, regular rhythm  and intact distal pulses.    Pulmonary/Chest: Effort normal and breath sounds normal.   Abdominal: Soft. Bowel sounds are normal. She exhibits no distension. There is no tenderness.   Lymphadenopathy:     She has no cervical adenopathy.   Neurological: She is alert.   Psychiatric: She exhibits a depressed mood.     Impression and Plan    (462) Acute pharyngitis, unspecified pharyngitis type  (primary encounter diagnosis)  Plan: likely viral etiology, discussed symptomatic tx  - Dexamethasone 6 MG Oral Tab    (276.1) Hyponatremia  Plan: non-focal neuro exam. Labs today, to be reviewed w PCP in 2-3 days  - BASIC METABOLIC PANEL, CBC (HEMOGRAM), THYROID STIMULATING HORMONE    (564.00) Constipation, unspecified constipation type  Plan: non-acute abdominal exam  - Polyethylene Glycol 3350 Oral Powder    Exam unremarkable today. Reassurance provided. Pt to follow-up w PCP in 2-3 days. ER/Return precautions provided for worsening any worsening sxs.    After visit summary given to patient.    Follow up as needed, or if symptoms worsen.    Artelia Laroche, M.D.  Urgent Care  [facility], [place]

## 2014-04-17 NOTE — Patient Instructions (Signed)
Laryngitis    Laryngitis is a swelling of the tissues around the vocal cords. As a result, your voice may be hoarse or perhaps you can only speak in a whisper. This may be caused by a viral illness, such as a head or chest cold. It may also be due to overuse and strain of the voice (yelling or screaming). This condition will resolve in a few days.  Home Care:   Rest the voice until it recovers. Talk as little as possible.   Breathing cool steam from a humidifier/vaporizer or in a steamy shower is helpful.   Drink plenty of fluids to stay well hydrated.   You may use acetaminophen (Tylenol) or ibuprofen (Motrin, Advil) to control pain, unless another medicine was prescribed. [NOTE: If you have chronic liver or kidney disease or ever had a stomach ulcer or GI bleeding, talk with your doctor before using these medicines.] (Aspirin should never be used in anyone under 18 years of age who is ill with a fever. It may cause severe liver damage.)  Follow Up  with your doctor or this facility if you have not improved after one week.  Get Prompt Medical Attention  if any of the following occur:   Severe pain with swallowing   Neck swelling   Noisy breathing or trouble breathing   Fever of 100.4F (38C) or higher, or as directed by your healthcare provider   2000-2015 [employer] [address], [place] [address]. All rights reserved. This information is not intended as a substitute for professional medical care. Always follow your healthcare professional's instructions.

## 2014-04-17 NOTE — Telephone Encounter (Signed)
The patient last received this medication at the requesting pharmacy  Sumatriptan /act soln         01/23/13

## 2014-04-18 LAB — CBC (HEMOGRAM)
Hematocrit: 39 % (ref 36–45)
Hemoglobin: 13.3 g/dL (ref 11.5–15.5)
MCH: 29.6 pg (ref 27.3–33.6)
MCHC: 33.8 g/dL (ref 32.2–36.5)
MCV: 88 fL (ref 81–98)
Platelet Count: 334 10*3/uL (ref 150–400)
RBC: 4.49 mil/uL (ref 3.80–5.00)
RDW-CV: 12.5 % (ref 11.6–14.4)
WBC: 11.05 10*3/uL — ABNORMAL HIGH (ref 4.3–10.0)

## 2014-04-18 LAB — BASIC METABOLIC PANEL
Anion Gap: 10 (ref 4–12)
Calcium: 9.6 mg/dL (ref 8.9–10.2)
Carbon Dioxide, Total: 24 mEq/L (ref 22–32)
Chloride: 98 mEq/L (ref 98–108)
Creatinine: 0.74 mg/dL (ref 0.38–1.02)
GFR, Calc, African American: >60 mL/min (ref >59)
GFR, Calc, European American: >60 mL/min (ref >59)
Glucose: 131 mg/dL — ABNORMAL HIGH (ref 62–125)
Potassium: 3.8 mEq/L (ref 3.6–5.2)
Sodium: 132 mEq/L — ABNORMAL LOW (ref 135–145)
Urea Nitrogen: 6 mg/dL — ABNORMAL LOW (ref 8–21)

## 2014-04-18 LAB — THYROID STIMULATING HORMONE: Thyroid Stimulating Hormone: 1.682 u[IU]/mL (ref 0.400–5.000)

## 2014-04-18 MED ORDER — SUMATRIPTAN 20 MG/ACT NA SOLN
1.0000 | NASAL | Status: DC | PRN
Start: 2014-04-18 — End: 2014-04-20

## 2014-04-19 ENCOUNTER — Ambulatory Visit (INDEPENDENT_AMBULATORY_CARE_PROVIDER_SITE_OTHER): Payer: No Typology Code available for payment source | Admitting: Surgery

## 2014-04-19 ENCOUNTER — Encounter (INDEPENDENT_AMBULATORY_CARE_PROVIDER_SITE_OTHER): Payer: Self-pay | Admitting: Surgery

## 2014-04-19 VITALS — BP 120/62 | HR 77 | Resp 14 | Ht 62.0 in | Wt 170.0 lb

## 2014-04-19 DIAGNOSIS — Z09 Encounter for follow-up examination after completed treatment for conditions other than malignant neoplasm: Secondary | ICD-10-CM | POA: Insufficient documentation

## 2014-04-19 NOTE — Progress Notes (Signed)
Christina Mathews 43 year old female POD#15 s/p laparoscopic cholecystectomy cholangiogram who presents for post op visit.     Pt. was admitted to [facility] admissions for hyponatremia with a sodium of 114.  She states no etiology was given to her hyponatremia and that she has a follow-up with Dr. Scarlette Ar tomorrow and a nephrologist in a few weeks.  She is feeling much better overall.  She has no pain, no n/v, no f/c.   she has some constipation.  He was given mag citrate while in the hospital and cleaned out, however is not had a bowel movement in 2 days.   Tolerating good po. No CP, no SOB    Filed Vitals:    04/19/14 1321   BP: 120/62   Pulse: 77   Resp: 14   Height:  (1.575 m)   Weight: 170 lb (77.111 kg)     PHYSICAL EXAM:  General: healthy, alert, no distress  Abd: soft, non-tender. BS normal. 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  Continue  low-fatdiet. Increase activity as tolerated.  Call if develop fever, n/v or worsening abdominal pain.  Follow up as needed

## 2014-04-20 ENCOUNTER — Encounter (INDEPENDENT_AMBULATORY_CARE_PROVIDER_SITE_OTHER): Payer: Self-pay | Admitting: Internal Medicine

## 2014-04-20 ENCOUNTER — Ambulatory Visit (INDEPENDENT_AMBULATORY_CARE_PROVIDER_SITE_OTHER): Payer: No Typology Code available for payment source | Admitting: Internal Medicine

## 2014-04-20 VITALS — BP 127/83 | HR 78 | Temp 97.4°F | Wt 171.0 lb

## 2014-04-20 DIAGNOSIS — G43909 Migraine, unspecified, not intractable, without status migrainosus: Secondary | ICD-10-CM

## 2014-04-20 DIAGNOSIS — E871 Hypo-osmolality and hyponatremia: Secondary | ICD-10-CM

## 2014-04-20 DIAGNOSIS — N6315 Unspecified lump in the right breast, overlapping quadrants: Secondary | ICD-10-CM

## 2014-04-20 DIAGNOSIS — R21 Rash and other nonspecific skin eruption: Secondary | ICD-10-CM

## 2014-04-20 DIAGNOSIS — N63 Unspecified lump in unspecified breast: Secondary | ICD-10-CM

## 2014-04-20 DIAGNOSIS — J069 Acute upper respiratory infection, unspecified: Secondary | ICD-10-CM

## 2014-04-20 LAB — BASIC METABOLIC PANEL
Anion Gap: 9 (ref 4–12)
Calcium: 9.1 mg/dL (ref 8.9–10.2)
Carbon Dioxide, Total: 24 mEq/L (ref 22–32)
Chloride: 103 mEq/L (ref 98–108)
Creatinine: 0.79 mg/dL (ref 0.38–1.02)
GFR, Calc, African American: >60 mL/min (ref >59)
GFR, Calc, European American: >60 mL/min (ref >59)
Glucose: 81 mg/dL (ref 62–125)
Potassium: 4.3 mEq/L (ref 3.6–5.2)
Sodium: 136 mEq/L (ref 135–145)
Urea Nitrogen: 6 mg/dL — ABNORMAL LOW (ref 8–21)

## 2014-04-20 MED ORDER — SUMATRIPTAN 20 MG/ACT NA SOLN
1.0000 | NASAL | Status: DC | PRN
Start: 2014-04-20 — End: 2015-10-04

## 2014-04-20 NOTE — Progress Notes (Signed)
Patient Referred By: No ref. provider found  Patient's PCP: Christina Mathews (General)     Subjective:  Patient is a 43 year old female, here to discuss Hospital F/U    The following portions of the patient's history were reviewed with the patient and updated as appropriate: problem list, current medications, allergies, past medical history, past surgical history, past social history and past family history.    HPI Comments:      pt is here to follow up on recent e.r. Visit   She went [facility] to [place] [facility] ago , with severe nausea and vomiting   Was found to have a low sodium @ 116   She recently had a cholecystectomy on 9/11 and post the surgery she had been drinking lot of free water     She also had low sodium before her surgery on 8/27 when she was in [place] , and was admitted to a hospital   At that point her sodium was 120     Usually , in a day she drinks about 3 litres of water , in addition to coffee   Has nephrology appt on oct 8 th       2) she has been having a skin rash over her rt cubital fossa   Since her discharge from hospital   Also has swollen , painful rt axillary lymph nodes         3) throat discomfort   She feels like her throat is swollen , and " feels squeezed "   Has pnd   + cough and runny nose   no fevers , wheezing , sob     4) she has noticed a rt breast " lump " for the past few weeks   This is tender to touch   She is due for her mammogram         Review of Systems   Constitutional: Negative for unexpected weight change.   Neurological: Positive for weakness.         Objective:  Filed Vitals:    04/20/14 1003   BP: 127/83   Pulse: 78   Temp: 97.4 F (36.3 C)   TempSrc: Temporal   Weight: 171 lb (77.565 kg)   SpO2: 96%       Physical Exam   Constitutional: She appears well-developed and well-nourished.   HENT:   Head: Normocephalic and atraumatic.   Right Ear: Hearing, tympanic membrane, external ear and ear canal normal.   Left Ear: Hearing, tympanic membrane,  external ear and ear canal normal.   Nose: Mucosal edema present.   Mouth/Throat: Posterior oropharyngeal erythema present.   Eyes: EOM are normal. Pupils are equal, round, and reactive to light.   Neck: Normal range of motion.   Cardiovascular: Normal rate and regular rhythm.    No murmur heard.  Pulmonary/Chest: She has no wheezes. She has no rales. Right breast exhibits mass and tenderness. Right breast exhibits no nipple discharge. Left breast exhibits no mass, no nipple discharge, no skin change and no tenderness.       Lymphadenopathy:     She has cervical adenopathy.   Skin:   Hives noted over rt cubital fossa    Psychiatric: She has a normal mood and affect.        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 Take at onset. May repeat x 1 dose. Max 40 mg/24 hours.    Breast lump on right side at 6 o'clock position  On exam she has a soft , mobile  , 2 cm tender breast  nodule at 6 o clock   Will refer to breast clinic for ultrasound , mammogram and eval   - REFERRAL TO BREAST CLINIC    Skin rash  Pt has hives over rt cubital fossa   Has associated rt axillary lymphadenopathy ( tender )  She was in hospital recently and the rash seems to co incide with places where surgical tape was applied   rec taking benadryl for the rash     Ibuprofen for axillary pain     For the axillary lymphadenopathy , --- i do not think this is related to her breast nodule as that appeared much earlier     URI (upper respiratory infection)    She seems to be recovering from a viral uri  rec pushing fluids , warm water gargles    home remedies like honey and ginger in tea       I spent a total time of 40 minutes face-to-face with the patient, of which more than 50% was spent counseling and coordinating care  And answering questions that pt had  as outlined in this note.

## 2014-04-20 NOTE — Progress Notes (Signed)
Reason for Visit: Patient is being seen for [place] follow up   -She would like her sodium levels checked.     Refills? YES   Referral? NO  Letter or Form? NO  Lab Results? NO    HEALTH MAINTENANCE:  Has the patient has this done since their last visit?  Cervical screening/PAP: N/A  Mammo: N/A  Colon Screen: N/A    Have you seen a specialist since your last visit: No    Vaccines Due? No    HM Due:   Health Maintenance   Topic Date Due   . INFLUENZA VACCINE (1) 03/27/2014   . PAP 1 YEAR W/IB MESSAGE  11/23/2014   . PAP 3 YEAR W/IB MESSAGE  11/22/2016   . TETANUS BOOSTER  11/23/2023       PCP Verified?  Yes, atkins

## 2014-04-20 NOTE — Patient Instructions (Signed)
Thank you :)

## 2014-04-21 ENCOUNTER — Encounter (INDEPENDENT_AMBULATORY_CARE_PROVIDER_SITE_OTHER): Payer: Self-pay | Admitting: Internal Medicine

## 2014-04-23 ENCOUNTER — Encounter (INDEPENDENT_AMBULATORY_CARE_PROVIDER_SITE_OTHER): Payer: No Typology Code available for payment source | Admitting: Surgery

## 2014-04-23 ENCOUNTER — Other Ambulatory Visit: Payer: Self-pay | Admitting: Family Medicine

## 2014-04-23 DIAGNOSIS — G43909 Migraine, unspecified, not intractable, without status migrainosus: Secondary | ICD-10-CM

## 2014-04-23 MED ORDER — SUMATRIPTAN SUCCINATE 25 MG OR TABS
25.0000 mg | ORAL_TABLET | ORAL | Status: DC | PRN
Start: 2014-04-23 — End: 2015-10-04

## 2014-04-23 NOTE — Telephone Encounter (Signed)
Dr. Vickey Sages, per pharmacy fax Sumatriptan nasal is currently unavailable and thus, requesting a new rx for tabs. Rx for  tabs pre-loaded below for you to approve/deny as appropriate.

## 2014-04-23 NOTE — Telephone Encounter (Signed)
done

## 2014-04-24 ENCOUNTER — Encounter (INDEPENDENT_AMBULATORY_CARE_PROVIDER_SITE_OTHER): Payer: Self-pay

## 2014-04-25 DIAGNOSIS — N644 Mastodynia: Secondary | ICD-10-CM

## 2014-04-25 DIAGNOSIS — N63 Unspecified lump in unspecified breast: Secondary | ICD-10-CM

## 2014-04-28 ENCOUNTER — Ambulatory Visit (INDEPENDENT_AMBULATORY_CARE_PROVIDER_SITE_OTHER): Payer: No Typology Code available for payment source | Admitting: Internal Medicine

## 2014-04-28 ENCOUNTER — Encounter (INDEPENDENT_AMBULATORY_CARE_PROVIDER_SITE_OTHER): Payer: No Typology Code available for payment source | Admitting: Internal Medicine

## 2014-04-28 ENCOUNTER — Encounter (INDEPENDENT_AMBULATORY_CARE_PROVIDER_SITE_OTHER): Payer: Self-pay | Admitting: Internal Medicine

## 2014-04-28 VITALS — BP 113/79 | HR 87 | Temp 98.1°F | Resp 20 | Wt 172.0 lb

## 2014-04-28 DIAGNOSIS — I891 Lymphangitis: Secondary | ICD-10-CM

## 2014-04-28 MED ORDER — SULFAMETHOXAZOLE-TRIMETHOPRIM 800-160 MG OR TABS
1.0000 | ORAL_TABLET | Freq: Two times a day (BID) | ORAL | Status: DC
Start: 2014-04-28 — End: 2014-05-16

## 2014-04-28 NOTE — Progress Notes (Signed)
Patient Referred By: No ref. provider found  Patient's PCP: Venia MinksAtkins, Tamara Zina Anne (General)     Subjective:  Patient is a 43 year old female, here to discuss Infection    The following portions of the patient's history were reviewed with the patient and updated as appropriate: problem list, current medications, allergies, past medical history, past surgical history, past social history and past family history.    HPI Comments:     Pt is here to follow up       1) since her last visit 1 week ago , she has been having more pain in her rt arm and axilla   This radiates across to her rt chest   No fevers   She has been generally feeling unwell       2) hyponatremia -- she has upcoming appt with nephrology this week           Infection  Pertinent negatives include no weakness.       Review of Systems   Constitutional: Negative for unexpected weight change.   Neurological: Negative for weakness.         Objective:  Filed Vitals:    04/28/14 1137   BP: 113/79   Pulse: 87   Temp: 98.1 F (36.7 C)   TempSrc: Oral   Resp: 20   Weight: 172 lb (78.019 kg)   SpO2: 98%       Physical Exam   Constitutional: She appears well-developed and well-nourished.   Lymphadenopathy:     She has cervical adenopathy.   Psychiatric: She has a normal mood and affect.       Lymph nodes :  She has enlarged lymph nodes in the rt axillary chain , tender to palpation   Lymph nodes are enlarged in the right arm, axilla, or right infraclavicular chain as well as right anterior cervical          Assessment and Plan:   Diagnoses and associated orders for this visit:    Lymphangitis  Patient has had tender lymphadenopathy in her right arm, axilla, infraclavicular area as well as right anterior cervical lymph nodes ever since her recent discharge from the hospital  Her symptoms basically started localized to the IV site over her right arm  I suspect lymphangitis, will give her a trial of Bactrim  Also recommend local warm compresses, ibuprofen for pain  and leg elevation  If this does not help suggested she call us back  - Sulfamethoxazole-Trimethoprim (BACTRIM DS) 800-160 MG Oral Tab; Take 1 tablet by mouth 2 times a day. Take until gone.

## 2014-04-28 NOTE — Patient Instructions (Signed)
Thank you for choosing [facility]. It was a pleasure to see you in clinic today. Your Medical Assistant was: Kathryn Richey  C-MA (AAMA)              You can schedule an appointment to see us by calling  [phone] or via eCare.     If labs were ordered today the results are expected to be available via eCare 5 days later. Otherwise, result letters are mailed 7-10 days after your tests are completed. If your physician needs to change your care based on your results, you will receive a phone call to notify you. If you haven't heard from him/her and it has been more than 10 days please give us a call.     Thank you for choosing [facility].    You may receive a survey in the mail asking how your experience has been with [facility].  Your input and opinions are very important to us.  I hope you are able to take the time to complete this survey.  Thank you very much.

## 2014-04-28 NOTE — Progress Notes (Signed)
Reason for Visit: pt is her today to follow up on her last office visit and she is not feeling any better. Pt is not sure if there is an infection, there is headache,dizziness and some nausea. And pt states that the puffy ness from her upper right arm has now moved across her chest and up though her throat.      Refills? NO  Referral? NO  Letter or Form? NO  Lab Results? NO    HEALTH MAINTENANCE:  Has the patient has this done since their last visit?  Cervical screening/PAP: N/A  Mammo: No - Order Pended  Colon Screen: N/A    Have you seen a specialist since your last visit: No    Vaccines Due? No    HM Due:   Health Maintenance   Topic Date Due   . INFLUENZA VACCINE (1) 03/27/2014   . PAP 1 YEAR W/IB MESSAGE  11/23/2014   . PAP 3 YEAR W/IB MESSAGE  11/22/2016   . TETANUS BOOSTER  11/23/2023       PCP Verified?  Yes, Vickey SagesAtkins

## 2014-04-30 ENCOUNTER — Ambulatory Visit (INDEPENDENT_AMBULATORY_CARE_PROVIDER_SITE_OTHER): Payer: Self-pay | Admitting: Family Medicine

## 2014-04-30 NOTE — Telephone Encounter (Signed)
No SDA appts avail.     I do not see a triage encounter.     To triage pool to assist.

## 2014-04-30 NOTE — Telephone Encounter (Signed)
"   I was seen in the office on 10/3 for lymphangitis" I started taking the Bactrim Sunday at 10: 00 Am" Within 2 hours of taking the Bactrim I began to feel tightness in my legs, ankles, forearms" The pain got worse over the weekend" It' s been a 10/10 and now its a 8/10. I was hospitalized 2 weeks ago for hyponatremia" " this feels like that again" " i've been peeing a lot even though I haven't been drinking much water" "I stopped the Bactrim"

## 2014-04-30 NOTE — Telephone Encounter (Signed)
CONFIRMED PHONE NUMBER: [phone]   CALLERS FIRST AND LAST NAME: Christina JohnsStephanie Jean Mathews  FACILITY NAME: na TITLE: na  CALLERS RELATIONSHIP:Self  RETURN CALL: OK to leave detailed message with anyone that answers     SUBJECT: Appointment Request   REASON FOR REQUEST: Follow up from Saturday's Appointment    REQUEST APPOINTMENT WITH: Scarlette Arhopra or Vickey SagesAtkins  REFERRING PROVIDER: na  REQUESTED DATE: 04/30/2014  REQUESTED TIME: in the morning please  UNABLE TO APPOINT: Other: Patient wanted to come in the morning to see either Chopra or Atkins. There were nothing sooner than 2 PM. Patient is complaining of possible medication reaction so I went ahead and also transferred to CCL. Please call patient back. Thank you

## 2014-04-30 NOTE — Telephone Encounter (Signed)
CONFIRMED PHONE NUMBER: [phone]  CALLERS FIRST AND LAST NAME: Malvin JohnsStephanie Jean Rodman  FACILITY NAME: na TITLE: na  CALLERS RELATIONSHIP:Self  RETURN CALL: Detailed message on voicemail only     SUBJECT: General Message   REASON FOR REQUEST: Same day appointment request    MESSAGE: Patient calling back after speaking to the CCL RN, patient was advised she needs to be seen for a same day appointment and would like to talk to the clinic RN about this. Please call back to further assist. Thank you.

## 2014-04-30 NOTE — Telephone Encounter (Signed)
Protocol: LEG PAIN-ADULT-OH  Negative: Looks like a broken bone or dislocated joint (e.g., crooked or deformed)  Negative: Sounds like a life-threatening emergency to the triager  Negative: Followed a hip injury  Negative: Followed a knee injury  Negative: Followed an ankle or foot injury  Negative: Back pain radiating into leg(s)  Negative: Foot pain is the main symptom  Negative: Ankle pain is the main symptom  Negative: Knee pain is the main symptom  Negative: Leg swelling is the main symptom  Negative: Chest pain  Negative: Difficulty breathing  Negative: Entire foot is cool or blue in comparison to other side  Affirmative: Unable to walk  Disposition of Go to ED Now suggested.   It's very painful to walk. Last night it was a 10/10. Today it's a 8/10.

## 2014-05-03 ENCOUNTER — Ambulatory Visit (HOSPITAL_BASED_OUTPATIENT_CLINIC_OR_DEPARTMENT_OTHER): Payer: No Typology Code available for payment source

## 2014-05-03 ENCOUNTER — Ambulatory Visit: Payer: No Typology Code available for payment source | Attending: Nurse Practitioner | Admitting: Nurse Practitioner

## 2014-05-03 ENCOUNTER — Encounter (INDEPENDENT_AMBULATORY_CARE_PROVIDER_SITE_OTHER): Payer: Self-pay | Admitting: Family Medicine

## 2014-05-03 ENCOUNTER — Ambulatory Visit (INDEPENDENT_AMBULATORY_CARE_PROVIDER_SITE_OTHER): Payer: No Typology Code available for payment source | Admitting: Family Medicine

## 2014-05-03 VITALS — BP 138/85 | HR 85 | Temp 99.2°F | Resp 18 | Wt 173.0 lb

## 2014-05-03 DIAGNOSIS — R221 Localized swelling, mass and lump, neck: Secondary | ICD-10-CM

## 2014-05-03 DIAGNOSIS — N63 Unspecified lump in breast: Secondary | ICD-10-CM

## 2014-05-03 DIAGNOSIS — N644 Mastodynia: Secondary | ICD-10-CM | POA: Insufficient documentation

## 2014-05-03 MED ORDER — TRAMADOL HCL 50 MG OR TABS
50.0000 mg | ORAL_TABLET | Freq: Four times a day (QID) | ORAL | Status: DC | PRN
Start: 2014-05-03 — End: 2014-05-16

## 2014-05-03 NOTE — Progress Notes (Signed)
Chief Complaint   Patient presents with   . Dizziness     faint dizziness x 4 days    . Swelling     neck, head x 2 weeks    . Throat Problem     sore throat, H/A x 2 weeks     S: patient has a complicated history, for at least a month she has been having symptoms of swollen glands, feverish, fatigue and low energy. She has been seen at least twice and she just completed a course of Bactrim for adenopathy. She has this anterior neck pain and it hurts to swallow w/o having a sore throat.     Recent h/o hyponatremia, s/p cholecystectomy and h/o thyroid nodules    Patient Active Problem List   Diagnosis   . ALLERGIC RHINITIS NOS   . 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 CHRONIC SINUSITIS NOS   . Personal history of cervical dysplasia   . Fatigue   . Thyroid nodule   . RUQ abdominal pain   . Biliary dyskinesia   . Gallbladder polyp   . Postop check     Outpatient Prescriptions Prior to Visit   Medication Sig Dispense Refill   . Calcium Carbonate-Vitamin D (CALCIUM + D OR) None Entered     . Cetirizine HCl (ZYRTEC) 10 MG Oral Tab 1 TABLET DAILY     . Cyclobenzaprine HCl 10 MG Oral Tab TAKE ONE TABLET BY MOUTH THREE TIMES A DAY AS NEEDED FOR MUSCLE PAIN 60 tablet 1   . Multiple Vitamin (MULTI-VITAMIN OR) once daily     . Naproxen 500 MG Oral Tab Take 1 tablet by mouth 2 times per day as needed at the first sign of migraine 30 tablet 3   . NECON 0.5/35, 28, 0.5-35 MG-MCG Oral Tab TAKE 1 TABLET BY MOUTH EVERY DAY CONTINOUSLY 112 tablet 4   . Polyethylene Glycol 3350 Oral Powder Take 17 g by mouth daily as needed for constipation. Fill cap with powder to the 17 gram mark and dissolve in 4 to 8 ounces of water. 119 g 0   . QUERCETIN OR for allergies     . Sulfamethoxazole-Trimethoprim (BACTRIM DS) 800-160 MG Oral Tab Take 1 tablet by mouth 2 times a day. Take until gone. 10 tablet 0   . SUMAtriptan 20 MG/ACT Nasal Solution Spray 1 spray (20 mg) into left nostril every 2 hours as needed for migraines. Take at onset. May  repeat x 1 dose. Max 40 mg/24 hours. 12 Inhaler 4   . SUMAtriptan Succinate 25 MG Oral Tab Take 1 tablet (25 mg) by mouth every 2 hours as needed for migraines. Take at onset of migraine. May repeat x 1 dose. Max 200 mg/24 hours 20 tablet 2     No facility-administered medications prior to visit.       Review of Systems   Constitutional: Positive for diaphoresis, appetite change and fatigue. Negative for fever and chills.   HENT: Negative.    Respiratory: Negative.    Cardiovascular: Negative.    Gastrointestinal: Negative.    Musculoskeletal: Positive for neck pain.     BP 138/85 mmHg  Pulse 85  Temp(Src) 99.2 F (37.3 C) (Temporal)  Resp 18  Wt 173 lb (78.472 kg)  SpO2 98%    Physical Exam   HENT:   Right Ear: Tympanic membrane, external ear and ear canal normal.   Left Ear: Tympanic membrane, external ear and ear canal normal.   Mouth/Throat: Uvula  is midline and oropharynx is clear and moist. No oropharyngeal exudate.   Neck:       Cardiovascular: Normal rate, regular rhythm and normal heart sounds.    Pulmonary/Chest: Breath sounds normal. She has no wheezes. She has no rhonchi.   Musculoskeletal: She exhibits no edema.     Diagnoses and associated orders for this visit:    Neck swelling  - SED RATE  - CRP WITH CARDIAC RISK  - CBC, DIFF  - T3 (FREE)  - T4, FREE  - ELECTROLYTES  - TraMADol HCl 50 MG Oral Tab; Take 1 tablet (50 mg) by mouth every 6 hours as needed for pain.  - PARATHYROID HORMONE    Unclear of the neck pain, no definable mass and unclear on how precede.  Will notify her PCP and to follow up on her labs

## 2014-05-03 NOTE — Progress Notes (Addendum)
Patient was sent to lab after her visit, she appeared to have a vaso-vgal reaction w/o LOC. It has been an hour since the blood draw and she is still feeling faint. Her vital sign lying down was 134/84 and pulse 74 with O2 at 100%, sitting vital signs was 139/94 and pulse 84. She still feels faint.  Her husband is present and we recommended she go to the ER for evaluation and she probably needs IV hydration.    She is refusing EMT transport for now and husband feel confident he can help transport her.      We were able to get her to the car, stable an [facility]    Charolotte Ekeaesar Shequilla Goodgame MD  Urgent Care

## 2014-05-04 ENCOUNTER — Encounter (INDEPENDENT_AMBULATORY_CARE_PROVIDER_SITE_OTHER): Payer: Self-pay | Admitting: Family Medicine

## 2014-05-04 ENCOUNTER — Ambulatory Visit (INDEPENDENT_AMBULATORY_CARE_PROVIDER_SITE_OTHER): Payer: No Typology Code available for payment source | Admitting: Family Medicine

## 2014-05-04 VITALS — BP 128/85 | HR 73 | Temp 97.8°F | Resp 12 | Wt 173.4 lb

## 2014-05-04 DIAGNOSIS — E041 Nontoxic single thyroid nodule: Secondary | ICD-10-CM

## 2014-05-04 DIAGNOSIS — M542 Cervicalgia: Secondary | ICD-10-CM

## 2014-05-04 DIAGNOSIS — R591 Generalized enlarged lymph nodes: Secondary | ICD-10-CM

## 2014-05-04 DIAGNOSIS — F419 Anxiety disorder, unspecified: Secondary | ICD-10-CM

## 2014-05-04 DIAGNOSIS — R61 Generalized hyperhidrosis: Secondary | ICD-10-CM

## 2014-05-04 LAB — CBC, DIFF
% Basophils: 0 %
% Eosinophils: 2 %
% Immature Granulocytes: 0 %
% Lymphocytes: 31 %
% Monocytes: 10 %
% Neutrophils: 57 %
Absolute Eosinophil Count: 0.15 10*3/uL (ref 0.00–0.50)
Absolute Lymphocyte Count: 2.46 10*3/uL (ref 1.00–4.80)
Basophils: 0.03 10*3/uL (ref 0.00–0.20)
Hematocrit: 43 % (ref 36–45)
Hemoglobin: 14.5 g/dL (ref 11.5–15.5)
Immature Granulocytes: 0.02 10*3/uL (ref 0.00–0.05)
MCH: 30.4 pg (ref 27.3–33.6)
MCHC: 33.9 g/dL (ref 32.2–36.5)
MCV: 90 fL (ref 81–98)
Monocytes: 0.76 10*3/uL (ref 0.00–0.80)
Neutrophils: 4.47 10*3/uL (ref 1.80–7.00)
Platelet Count: 289 10*3/uL (ref 150–400)
RBC: 4.77 mil/uL (ref 3.80–5.00)
RDW-CV: 12.8 % (ref 11.6–14.4)
WBC: 7.89 10*3/uL (ref 4.3–10.0)

## 2014-05-04 LAB — CRP WITH CARDIAC RISK: C_Reactive Protein: 17.6 mg/L — ABNORMAL HIGH (ref 0.0–10.0)

## 2014-05-04 LAB — ELECTROLYTES
Anion Gap: 8 (ref 4–12)
Carbon Dioxide, Total: 27 mEq/L (ref 22–32)
Chloride: 97 mEq/L — ABNORMAL LOW (ref 98–108)
Potassium: 3.9 mEq/L (ref 3.6–5.2)
Sodium: 132 mEq/L — ABNORMAL LOW (ref 135–145)

## 2014-05-04 LAB — SED RATE

## 2014-05-04 LAB — PARATHYROID HORMONE: Parathyroid Hormone: 26 pg/mL (ref 12–88)

## 2014-05-04 LAB — T4, FREE: Thyroxine (Free): 0.9 ng/dL (ref 0.6–1.2)

## 2014-05-04 LAB — T3 (FREE): T3 (Free): 3.3 pg/mL (ref 2.3–3.9)

## 2014-05-04 MED ORDER — HYDROCODONE-ACETAMINOPHEN 5-325 MG OR TABS
1.0000 | ORAL_TABLET | ORAL | Status: DC | PRN
Start: 2014-05-04 — End: 2015-08-04

## 2014-05-04 MED ORDER — LORAZEPAM 0.5 MG OR TABS
0.5000 mg | ORAL_TABLET | Freq: Three times a day (TID) | ORAL | Status: DC | PRN
Start: 2014-05-04 — End: 2016-06-22

## 2014-05-04 NOTE — Patient Instructions (Signed)
Laboratory tests today for lupus and TB.    Call and book ENT asap.     Book hematology also.     I will have staff contact you tomorrow for update.

## 2014-05-04 NOTE — Progress Notes (Signed)
Reason for visit: Thyroid      Immunizations: Flu  Cervical screening/PAP: no  Mammo: no  Colon Screen: no  Have you seen a specialist since your last visit: NO    If yes, Name and location and date:

## 2014-05-04 NOTE — Progress Notes (Signed)
Patient Referred By: No ref. provider found  Patient's PCP: Ace Bergfeld, Jennefer BravoLaura Jean (General)     Subjective:  Patient is a 43 year old female, here to discuss Thyroid Problem    The following portions of the patient's history were reviewed with the patient and updated as appropriate: problem list, current medications, allergies, past medical history, past surgical history, past social history and past family history.    HPI Comments: Patient here for follow up ER.     [facility].  Given lorazepam by IV which helped anxiety.  Patient having swelling in the neck over the last 3 months with night sweats and 10lb weight loss.  No fever. No risks for HIV or TB.  Prior to this patient presented with hyponatremia in which the cause is unknown to date.  Saw nephrology who stated kidneys functioning normal.  History of thyroid nodule.  Ultrasound neck in May.  Over the last year patient not feeling herself.  Patient also fatigued.      No smoking.        Review of Systems   Constitutional: Negative for fever.   HENT: Negative for sore throat.    Respiratory: Negative for cough.    Gastrointestinal: Negative for abdominal pain.   Musculoskeletal: Positive for arthralgias. Negative for joint swelling.   Skin: Negative for rash.   Neurological: Positive for dizziness and headaches.         Objective:  BP 128/85 mmHg  Pulse 73  Temp(Src) 97.8 F (36.6 C) (Temporal)  Resp 12  Wt 173 lb 6 oz (78.642 kg)  SpO2 100%   Physical Exam   Constitutional: Vital signs are normal. No distress.   Abdominal: There is splenomegaly. There is generalized tenderness.   Lymphadenopathy:     She has cervical adenopathy.        Right cervical: Superficial cervical adenopathy present.     She has axillary adenopathy.        Right axillary: Pectoral and lateral adenopathy present.        Right: Inguinal adenopathy present. No supraclavicular and no epitrochlear adenopathy present.        Left: Inguinal  adenopathy present. No supraclavicular and no epitrochlear adenopathy present.        Assessment and Plan:   Diagnoses and associated orders for this visit:    Lymphadenopathy  - X-RAY CHEST 2 VW  - REFERRAL TO OTO-HEAD NECK SURGERY  - REFERRAL TO HEMATOLOGY  - ANA REFLEX COMP PNL BTY  - QUANTIFERON TB TEST    Anxiety  - LORazepam 0.5 MG Oral Tab; Take 1 tablet (0.5 mg) by mouth 3 times a day as needed.    Thyroid nodule  - REFERRAL TO OTO-HEAD NECK SURGERY    Night sweats  - REFERRAL TO HEMATOLOGY  - ANA REFLEX COMP PNL BTY    Neck pain  - Hydrocodone-Acetaminophen 5-325 MG Oral Tab; Take 1-2 tablets by mouth every 4 hours as needed for pain. For pain. Not to exceed 8 tablets per day.  Chest xray reviewed with patient and no lymphadenopathy seen.    Patient has multiple symptoms which are concerning and possibly related. Symptoms going on for over a year but worse in the last 3 months.  Patient quite anxious and not sleeping.  Pain in anterior next up to 9/10.  Patient taking naproxen which is helping some.  With night sweats and weight loss in the setting of lymphadenopathy  concern for malignancy vs. Inflammatory condition.  ANA an tb testing today.  Referral for the enlarged neck lymph node.  Referral to hematology also but depending on ANA patient may need referral to rheumatology.      Discussed SIDE EFFECTS of treatment.  Patient will start with naproxen and only use lorazepam as needed.  Patient will fill prescription for vicodin only if severe pain.  Will have staff contact patient over weekend to see how lorazepam helping.     Patient agreeable to plan.   See instructions.   RETURN TO CLINIC if no improvement or change.     (Portions of this note were documented using [employer] which is a Medical illustratorvoice dictation software.  Although edited, some words which may sound like other words could be found in this note.)

## 2014-05-05 ENCOUNTER — Telehealth (INDEPENDENT_AMBULATORY_CARE_PROVIDER_SITE_OTHER): Payer: Self-pay | Admitting: Family Medicine

## 2014-05-05 LAB — ANA REFLEX COMPREHENSIVE PANEL
Ana Interpretation Comment 1: NEGATIVE
Ana Screen By Multiplex: NEGATIVE
Antibodies to Nuclear Ags by IF (ANA): NEGATIVE

## 2014-05-05 NOTE — Telephone Encounter (Addendum)
Called patient to check in, LMOVM- will try back Monday between 8-12pm    Per Dr. Ninfa MeekerMontour:    "Please call patient in the AM to see how the ativan is helping. Did she get an ENT appointment? Hematology??     Thanks.   Dr. Hiram ComberLaura Montour MD  [facility]  [address], [address]  [place], [place] [id number]  [facility] [phone]  FAX [phone]"

## 2014-05-07 ENCOUNTER — Encounter (INDEPENDENT_AMBULATORY_CARE_PROVIDER_SITE_OTHER): Payer: Self-pay | Admitting: Family Medicine

## 2014-05-07 LAB — QUANTIFERON TB TEST
Quantiferon TB Ag Result: 0.089 IU gamma IF/mL
Quantiferon TB MIT Result: >10 IU gamma IF/mL
Quantiferon TB Nil Result: 0.07 IU gamma IF/mL

## 2014-05-07 NOTE — Telephone Encounter (Signed)
I spoke to patient says the Ativan is helping.    She made appointments for ENT and Hematology for 05/09/14 and 05/10/14.    Patient is still complaining about dizziness. She wants to know if she should follow up with the other provider's? Or perhaps you could order a referral to Neuro.    [facility] by Dr. Roxan Hockeyobinson.    Routing to Provider, Referral Pended, Please advise.

## 2014-05-07 NOTE — Telephone Encounter (Signed)
Will defer to PCP, who will be back in the office tomorrow.    Joon Pohle M. Ilsa IhaSnyder, DO  [facility]

## 2014-05-08 ENCOUNTER — Telehealth (HOSPITAL_BASED_OUTPATIENT_CLINIC_OR_DEPARTMENT_OTHER): Payer: No Typology Code available for payment source | Admitting: Nurse Practitioner

## 2014-05-08 NOTE — Telephone Encounter (Signed)
RETURN TO CLINIC for recheck if changing or worsening.     See ENT and hematology.  No neuro referral.     Dr. Hiram ComberLaura Damario Gillie MD  [facility]  [address], [address]  [place], [place]  [id number]  [facility] [phone]  FAX [phone]

## 2014-05-08 NOTE — Telephone Encounter (Signed)
Waiting for provider response

## 2014-05-08 NOTE — Telephone Encounter (Signed)
LMVM, detailed message, left office number to schedule and appointment to see provider.    CCR: please schedule appointment for patient to see provider for a neuro referral.

## 2014-05-09 ENCOUNTER — Encounter (INDEPENDENT_AMBULATORY_CARE_PROVIDER_SITE_OTHER): Payer: No Typology Code available for payment source | Admitting: Family Medicine

## 2014-05-09 ENCOUNTER — Telehealth (INDEPENDENT_AMBULATORY_CARE_PROVIDER_SITE_OTHER): Payer: Self-pay | Admitting: Family Medicine

## 2014-05-09 NOTE — Telephone Encounter (Signed)
I have sent patient an ecare message and called her to see if we can get her in for a follow up with me.    Can you please check in with her tomorrow and see if we can get her scheduled with me?  SDA ok, 30 min please.  thanks

## 2014-05-09 NOTE — Telephone Encounter (Signed)
Forwarding to tomorrow.  Will call patient

## 2014-05-10 ENCOUNTER — Ambulatory Visit (HOSPITAL_BASED_OUTPATIENT_CLINIC_OR_DEPARTMENT_OTHER): Payer: No Typology Code available for payment source

## 2014-05-10 ENCOUNTER — Ambulatory Visit (HOSPITAL_BASED_OUTPATIENT_CLINIC_OR_DEPARTMENT_OTHER)
Payer: No Typology Code available for payment source | Attending: Hematology & Oncology | Admitting: Hematology & Oncology

## 2014-05-10 DIAGNOSIS — R634 Abnormal weight loss: Secondary | ICD-10-CM | POA: Insufficient documentation

## 2014-05-10 DIAGNOSIS — R208 Other disturbances of skin sensation: Secondary | ICD-10-CM | POA: Insufficient documentation

## 2014-05-10 DIAGNOSIS — R591 Generalized enlarged lymph nodes: Secondary | ICD-10-CM

## 2014-05-10 DIAGNOSIS — Z9049 Acquired absence of other specified parts of digestive tract: Secondary | ICD-10-CM | POA: Insufficient documentation

## 2014-05-10 DIAGNOSIS — R61 Generalized hyperhidrosis: Secondary | ICD-10-CM | POA: Insufficient documentation

## 2014-05-10 DIAGNOSIS — Z87828 Personal history of other (healed) physical injury and trauma: Secondary | ICD-10-CM | POA: Insufficient documentation

## 2014-05-10 DIAGNOSIS — G43909 Migraine, unspecified, not intractable, without status migrainosus: Secondary | ICD-10-CM | POA: Insufficient documentation

## 2014-05-10 NOTE — Telephone Encounter (Signed)
Called and left message for patient to call back to schedule.  °

## 2014-05-11 LAB — HEPATITIS B BATTERY (HBSAG W/RFLX PCR, ANTI-HBS, ANIT-HBC)
Hepatitis B Core Ab: NONREACTIVE
Hepatitis B Surf Antibody Intl Units: 60.2 IU
Hepatitis B Surface Ab: REACTIVE
Hepatitis B Surface Antigen w/Reflex: NONREACTIVE

## 2014-05-11 LAB — HEPATITIS C AB WITH REFLEX PCR: Hepatitis C Antibody w/Rflx PCR: NONREACTIVE

## 2014-05-11 NOTE — Telephone Encounter (Signed)
Appointed 05/16/14 x 30 minutes

## 2014-05-11 NOTE — Telephone Encounter (Signed)
LMTCB otherwise will try again after 3    Plan: offer sooner SDA     CCR: Okay to transfer to me at [phone] until 5

## 2014-05-11 NOTE — Telephone Encounter (Signed)
Looks like Verdis Fredericksonmber got her in on 10/21 (next Wednesday), closing this message.

## 2014-05-11 NOTE — Telephone Encounter (Signed)
Patient called to schedule follow up appointment with Dr.Atkins. CCR scheduled patient in a 30 minute slot for 05/26/14. Please call patient back if she needs to be seen sooner. Please advise, thank you.

## 2014-05-11 NOTE — Telephone Encounter (Signed)
How about on 10/26, ok for 30 min SDA

## 2014-05-11 NOTE — Telephone Encounter (Signed)
Will check with Dr.Atkins to see if 2 weeks is okay for patient's appointment on 05/26/14, thank you.

## 2014-05-12 ENCOUNTER — Other Ambulatory Visit (HOSPITAL_BASED_OUTPATIENT_CLINIC_OR_DEPARTMENT_OTHER): Payer: Self-pay

## 2014-05-13 LAB — CMV ABS, IGM AND IGG, SERUM
Cytomegalovirus Ab, IgG, Serum: NEGATIVE
Cytomegalovirus Ab, IgM, Serum: NEGATIVE

## 2014-05-15 ENCOUNTER — Encounter (INDEPENDENT_AMBULATORY_CARE_PROVIDER_SITE_OTHER): Payer: Self-pay | Admitting: Family Medicine

## 2014-05-15 NOTE — Telephone Encounter (Signed)
Please see ecare

## 2014-05-16 ENCOUNTER — Encounter (INDEPENDENT_AMBULATORY_CARE_PROVIDER_SITE_OTHER): Payer: Self-pay | Admitting: Family Medicine

## 2014-05-16 ENCOUNTER — Ambulatory Visit (INDEPENDENT_AMBULATORY_CARE_PROVIDER_SITE_OTHER): Payer: No Typology Code available for payment source | Admitting: Family Medicine

## 2014-05-16 VITALS — BP 125/72 | HR 76 | Temp 98.4°F | Resp 14 | Ht 62.99 in | Wt 173.2 lb

## 2014-05-16 DIAGNOSIS — E222 Syndrome of inappropriate secretion of antidiuretic hormone: Secondary | ICD-10-CM

## 2014-05-16 DIAGNOSIS — E871 Hypo-osmolality and hyponatremia: Secondary | ICD-10-CM

## 2014-05-16 DIAGNOSIS — N898 Other specified noninflammatory disorders of vagina: Secondary | ICD-10-CM

## 2014-05-16 LAB — PR WET PREP/KOH/TEST, ONSITE
Clue Cells: NEGATIVE
Odor: ABSENT
RBC, Wet Prep: NEGATIVE
Trich: NEGATIVE
Yeast, Wet Prep: NEGATIVE

## 2014-05-16 NOTE — Progress Notes (Signed)
Patient is a 43 year old female here for the following reasons:  Follow up fatigue    S:  We had seen each other in the spring and patient was having fatigue and symptoms of lymph node swelling in the neck that would come and go.  She had elevated CRP, otherwise labs were normal.  This summer she continued to feel fatigue.  [place] low [place] this summer. Went to ED. No intervention. She was having RUQ pain.  She had her gallbladder removed in Sept here in [place] at [facility].  She had 3 episodes of hyponatremia since her gallbladder removal.   She was hospitalized at [facility] for 3 days.  The first episode was associated with her drinking a lot of fluids because she thought she was dehydrated.    Saw a nephrologist at [facility]; reportedly her kidneys were normal and her sodium was slightly low at that time.    Then she has had ongoing issues with swelling in the neck and down into her right arm that comes and goes.    Saw Dr Levada Schilling 04/30/14, lymphangitis diagnosis, put on bactrim   Dr Levada Schilling referred to breast clinic at [facility] from a prior appointment after patient had found a nodule on her right breast  She was seen by [facility] on 05/03/14.  No abnormality on imaging, but she does have a palpable abnormality.  Recommended follow up in 3 months and trial of evening primrose oil for her breast pain.    That same day patient saw Dr Chuck Hint, urgent care, on 05/03/14, she had normal thyroid tests, parathyroid hormone, CBC, but her Na was 132 and CRP was 17.6 (ESR was QNS)    The next day she saw Dr Hulan Fray, which was an ER follow up visit for her low sodium, she was concerned about enlarged spleen and lymphadenopathy.  She was referred to ENT and hematology. At that visit her ANA and quantiferon test were normal.  chest x ray was normal.    Seen by hematology, Dr Phylliss Bob.   Her HepB, C, and CMV titers were normal. Neck CT was done yesterday, results are pending.    Saw Dr Brigitte Pulse, ENT at [facility].  He did  what sounds like a laryngoscopy.  He ordered a lymph node biopsy, but IR did not do this today due to it being close to the carotid artery.  She has a follow up with him on 10/28.    She has had a couple of scripts for Ativan from various providers for the anxiety due to all of the above.  This helps to some degree.  She was afraid to take it very much due to her ongoing symptoms from her prior head injury (head tingling, sedation)    She has seen Dr Quentin Cornwall in the past for headaches and migraines.  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     She has been limiting her fluid intake.    Her sodium was normal in 6/15 here, but she states that she is always fasting from food and water prior to her visits here.    On 9/19, her serum osm was 250 (low), and Na was 114 (low).  Ur osm 205   On 9/21, her Na was 126 and ur osm was 702  I do not see urine Na in the care everywhere records.    Current Outpatient Prescriptions   Medication Sig Dispense Refill   . Calcium Carbonate-Vitamin D (CALCIUM + D OR) None Entered     . Cetirizine HCl (ZYRTEC) 10 MG Oral Tab 1 TABLET DAILY     . Cyclobenzaprine HCl 10 MG Oral Tab TAKE ONE TABLET BY MOUTH THREE TIMES A DAY AS NEEDED FOR MUSCLE PAIN 60 tablet 1   . Hydrocodone-Acetaminophen 5-325 MG Oral Tab Take 1-2 tablets by mouth every 4 hours as needed for pain. For pain. Not to exceed 8 tablets per day. 10 tablet 0   . LORazepam 0.5 MG Oral Tab Take 1 tablet (0.5 mg) by mouth 3 times a day as needed. 20 tablet 0   . Multiple Vitamin (MULTI-VITAMIN OR) once daily     . NECON 0.5/35, 28, 0.5-35 MG-MCG Oral Tab TAKE 1 TABLET BY MOUTH EVERY DAY CONTINOUSLY 112 tablet 4   . Polyethylene Glycol 3350 Oral Powder Take 17 g by mouth daily as needed for constipation. Fill cap with powder to the 17 gram mark and  dissolve in 4 to 8 ounces of water. 119 g 0   . SUMAtriptan 20 MG/ACT Nasal Solution Spray 1 spray (20 mg) into left nostril every 2 hours as needed for migraines. Take at onset. May repeat x 1 dose. Max 40 mg/24 hours. 12 Inhaler 4   . SUMAtriptan Succinate 25 MG Oral Tab Take 1 tablet (25 mg) by mouth every 2 hours as needed for migraines. Take at onset of migraine. May repeat x 1 dose. Max 200 mg/24 hours 20 tablet 2     No current facility-administered medications for this visit.     History     Social History   . Marital Status: Married     Spouse Name: N/A     Number of Children: N/A   . Years of Education: N/A     Occupational History   . Not on file.     Social History Main Topics   . Smoking status: Never Smoker    . Smokeless tobacco: Never Used   . Alcohol Use: No      Comment: once per month, if that; none, 04/06/12   . Drug Use: No      Comment: confirmed, 04/06/12   . Sexual Activity: Yes      Comment: OCP     Other Topics Concern   . Not on file     Social History Narrative    09/02/10: Lives with her husband, Richardson Landry.  No children.    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        No actual formal hobbies.    Not a regular exerciser, tries to walk at lunchtime.        4/15:  Now working part time in her same position as academic advisor     ROS:  As per HPI and  Constit: has continued to have night sweats for a few months, now nightly  GU: she has been having vaginal itching since taking bactrim.  Took 1 dose of diflucan  Psych: denies nightmares, flashbacks, hypervigilance.    O:   BP 125/72 mmHg  Pulse 76  Temp(Src) 98.4 F (36.9 C) (Temporal)  Resp 14  Ht 5' 2.99" (1.6 m)  Wt 173 lb 3.2 oz (78.563 kg)  BMI 30.69 kg/m2  SpO2 100%  Gen: no acute distress  HEENT: conjuc/sclerae clear bilaterally.  OP clear, no erythema or lesions noted.  Neck: patient has tenderness to palpation and mild enlargement of the right superior anterior cervical lymph node, no palpable thyromegaly or nodules  CV: regular  rate and rhythm, no murmurs, rubs, or gallops  Resp: CLEAR TO AUSCULTATION BILATERALLY, no wheezes, crackles, or rales  Axillae:  No palpable lymphadenopathy but patient has tenderness to palpation in the right axillae  Abd: soft, nontender, nondistended, no palpable masses or hepatosplenomegaly  Ext: warm, no edema.  Skin: no rashes or concerning lesions noted.  Full skin exam not performed today.  PSYCHIATRIC:  Judgement/insight:  Normal, Mood/affect:  Normal., Orientation:  Normal.  GU: normal appearing external genitalia, on spec exam, moderate Wilson clumpy discharge, cervix appears normal.    A/P:    (E87.1) Hyponatremia  (primary encounter diagnosis)  Plan: MRI BRAIN WO/W CONTRAST, BASIC METABOLIC PANEL,        REFERRAL TO NEUROLOGY        With the serum and urine osmolality numbers from her hospitalization in Sept, and her reassuring consultation with nephrology, it seems that the possibility of a cerebral cause for her hyponatremia and possible SIADH needs to be ruled out.  I have requested records from nephrology  It may be reasonable to proceed with an MRI, and referral back to Dr Quentin Cornwall placed, as well.    (N89.8) Vaginal discharge  Plan: WET PREP/KOH/TEST, ONSITE        Will contact patient with results.    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  Rule out hyponatremia today.    Patient will follow up with Dr Brigitte Pulse about the possibility of cervical node biopsy if indicated, as IR was not able to do this at [facility].  Patient will follow up with Dr Phylliss Bob for the results of her neck CT , which I do not have today.    Follow up after MRI and above follow ups.      Dx, Tx, risks and alternatives discussed with patient and questions answered. Return to clinic if symptoms increase, persist, or worsen.     Face to face consultation included HPI, exam, review of differential diagnoses, rationale  for decision-making and use of medications including answering all questions and concerns

## 2014-05-16 NOTE — Progress Notes (Signed)
Reason for Visit: Follow up from last visit. She has not been feeling good since Aug     Refills? NO  Referral? NO  Letter or Form? NO  Lab Results? NO    HEALTH MAINTENANCE:  Has the patient has this done since their last visit?  Cervical screening/PAP: N/A  Mammo: N/A  Colon Screen: N/A    Have you seen a specialist since your last visit: No    Vaccines Due? Yes, patient declined     HM Due:   Health Maintenance   Topic Date Due   . INFLUENZA VACCINE (1) 03/27/2014   . PAP 1 YEAR W/IB MESSAGE  11/23/2014   . PAP 3 YEAR W/IB MESSAGE  11/22/2016   . TETANUS BOOSTER  11/23/2023       PCP Verified?  Yes, [place]

## 2014-05-16 NOTE — Patient Instructions (Addendum)
Thank you for choosing [facility]. It was a pleasure to see you in clinic today. Your Medical Assistant was: Pervis HockingEmmalie McCartney, [place]            You can schedule an appointment to see us by calling  [phone] or via eCare.     If labs were ordered today the results are expected to be available via eCare 5 days later. Otherwise, result letters are mailed 7-10 days after your tests are completed. If your physician needs to change your care based on your results, you will receive a phone call to notify you. If you haven't heard from him/her and it has been more than 10 days please give us a call.     You may receive a survey in the mail asking how your experience has been with [facility].  Your input and opinions are very important to us.  I hope you are able to take the time to complete this survey.    Thank you for choosing [facility].    Please make appointment for MRI of the brain and also with Dr Roxan Hockeyobinson.    Continue the water restriction, 1.5 liters per day.    Keep appointments with Dr Jeraldine LootsHammond and Dr Clelia CroftShaw.    Please come back to see me after the MRI and the above follow ups.

## 2014-05-17 ENCOUNTER — Telehealth (INDEPENDENT_AMBULATORY_CARE_PROVIDER_SITE_OTHER): Payer: Self-pay | Admitting: Family Medicine

## 2014-05-17 ENCOUNTER — Encounter (INDEPENDENT_AMBULATORY_CARE_PROVIDER_SITE_OTHER): Payer: Self-pay | Admitting: Family Medicine

## 2014-05-17 DIAGNOSIS — Z87820 Personal history of traumatic brain injury: Secondary | ICD-10-CM | POA: Insufficient documentation

## 2014-05-17 LAB — BASIC METABOLIC PANEL
Anion Gap: 7 (ref 4–12)
Calcium: 9.5 mg/dL (ref 8.9–10.2)
Carbon Dioxide, Total: 27 mEq/L (ref 22–32)
Chloride: 102 mEq/L (ref 98–108)
Creatinine: 0.84 mg/dL (ref 0.38–1.02)
GFR, Calc, African American: >60 mL/min (ref >59)
GFR, Calc, European American: >60 mL/min (ref >59)
Glucose: 76 mg/dL (ref 62–125)
Potassium: 4.2 mEq/L (ref 3.6–5.2)
Sodium: 136 mEq/L (ref 135–145)
Urea Nitrogen: 6 mg/dL — ABNORMAL LOW (ref 8–21)

## 2014-05-17 LAB — EBV SEROLOGY
EBV IgG Antibody: POSITIVE
EBV IgM Antibody: NEGATIVE
EBV Nuclear Antibody: POSITIVE

## 2014-05-17 NOTE — Telephone Encounter (Signed)
Emailed patient and also sent a message to Dr Roxan Hockeyobinson.  We may hold on the MRI until she is seen by neurologist.  Thanks.

## 2014-05-17 NOTE — Telephone Encounter (Signed)
Noted. Please let [facility] know when ready to pursuit MRI and we can resubmit prior authorization. Thanks!

## 2014-05-17 NOTE — Telephone Encounter (Signed)
Spoke with AIM, unable to accept current ICD-10 codes as they are hormone related.   Ordering provider to clarify on what is wanting to rule out with and patient's symptoms related to brain (chart note not completed yet). Thanks!

## 2014-05-18 ENCOUNTER — Telehealth (INDEPENDENT_AMBULATORY_CARE_PROVIDER_SITE_OTHER): Payer: Self-pay | Admitting: Neurology

## 2014-05-18 NOTE — Telephone Encounter (Signed)
CONFIRMED PHONE NUMBER: [phone]  CALLERS FIRST AND LAST NAME: Christina BlancoStephanie Jean Mathews  FACILITY NAME: NA TITLE: NA  CALLERS RELATIONSHIP:Self  RETURN CALL: Detailed message on voicemail only     SUBJECT: Appointment Request   REASON FOR REQUEST: Patient is calling in regards to her referral from Dr. Vickey SagesAtkins [facility]. She would like to be seen by Dr. Roxan Hockeyobinson because she was establish with her for a different medical concern last seen 02/2013. Please follow up.    REQUEST APPOINTMENT WITH: Dr. Roxan Hockeyobinson  REFERRING PROVIDER: Dr. Vickey SagesAtkins  REQUESTED DATE: Anyday  REQUESTED TIME: First thing in the morning or afternoon after 2pm  UNABLE TO APPOINT: Appointment length unavailable    Thank you.

## 2014-05-18 NOTE — Telephone Encounter (Signed)
CONFIRMED PHONE NUMBER: [phone]  CALLERS FIRST AND LAST NAME: Christina Mathews  FACILITY NAME: NA TITLE: NA  CALLERS RELATIONSHIP:Self  RETURN CALL: Detailed message on voicemail only     SUBJECT: Appointment Request   REASON FOR REQUEST: Patient is calling in regards to her referral from Dr. Atkins [facility]. She would like to be seen by Dr. Robinson because she was establish with her for a different medical concern last seen 02/2013. Please follow up.    REQUEST APPOINTMENT WITH: Dr. Robinson  REFERRING PROVIDER: Dr. Atkins  REQUESTED DATE: Anyday  REQUESTED TIME: First thing in the morning or afternoon after 2pm  UNABLE TO APPOINT: Appointment length unavailable    Thank you.

## 2014-05-18 NOTE — Telephone Encounter (Signed)
Just FYI.

## 2014-05-18 NOTE — Telephone Encounter (Signed)
Please advise pt on referral.  

## 2014-05-21 NOTE — Telephone Encounter (Signed)
Scheduled patient for an hour, she has a new problem and it's been a while since she's been in, maybe 2012.

## 2014-05-26 ENCOUNTER — Encounter (INDEPENDENT_AMBULATORY_CARE_PROVIDER_SITE_OTHER): Payer: No Typology Code available for payment source | Admitting: Family Medicine

## 2014-05-28 ENCOUNTER — Encounter (INDEPENDENT_AMBULATORY_CARE_PROVIDER_SITE_OTHER): Payer: Self-pay | Admitting: Family Medicine

## 2014-05-28 NOTE — Telephone Encounter (Signed)
Opened in error

## 2014-05-29 ENCOUNTER — Ambulatory Visit (HOSPITAL_BASED_OUTPATIENT_CLINIC_OR_DEPARTMENT_OTHER)
Payer: No Typology Code available for payment source | Attending: Hematology & Oncology | Admitting: Hematology & Oncology

## 2014-05-29 DIAGNOSIS — M79621 Pain in right upper arm: Secondary | ICD-10-CM | POA: Insufficient documentation

## 2014-05-29 DIAGNOSIS — G43909 Migraine, unspecified, not intractable, without status migrainosus: Secondary | ICD-10-CM | POA: Insufficient documentation

## 2014-05-29 DIAGNOSIS — R601 Generalized edema: Secondary | ICD-10-CM | POA: Insufficient documentation

## 2014-05-29 DIAGNOSIS — Z87820 Personal history of traumatic brain injury: Secondary | ICD-10-CM | POA: Insufficient documentation

## 2014-05-29 DIAGNOSIS — M25511 Pain in right shoulder: Secondary | ICD-10-CM | POA: Insufficient documentation

## 2014-05-29 DIAGNOSIS — R61 Generalized hyperhidrosis: Secondary | ICD-10-CM | POA: Insufficient documentation

## 2014-05-29 DIAGNOSIS — R634 Abnormal weight loss: Secondary | ICD-10-CM | POA: Insufficient documentation

## 2014-05-29 DIAGNOSIS — R591 Generalized enlarged lymph nodes: Secondary | ICD-10-CM

## 2014-05-31 ENCOUNTER — Encounter (INDEPENDENT_AMBULATORY_CARE_PROVIDER_SITE_OTHER): Payer: Self-pay | Admitting: Family Medicine

## 2014-05-31 ENCOUNTER — Ambulatory Visit (INDEPENDENT_AMBULATORY_CARE_PROVIDER_SITE_OTHER): Payer: No Typology Code available for payment source | Admitting: Family Medicine

## 2014-05-31 ENCOUNTER — Telehealth (INDEPENDENT_AMBULATORY_CARE_PROVIDER_SITE_OTHER): Payer: Self-pay | Admitting: Family Medicine

## 2014-05-31 ENCOUNTER — Other Ambulatory Visit: Payer: Self-pay

## 2014-05-31 VITALS — BP 127/82 | HR 74 | Temp 99.2°F | Resp 14 | Wt 171.0 lb

## 2014-05-31 DIAGNOSIS — R61 Generalized hyperhidrosis: Secondary | ICD-10-CM

## 2014-05-31 DIAGNOSIS — E871 Hypo-osmolality and hyponatremia: Secondary | ICD-10-CM

## 2014-05-31 DIAGNOSIS — H53459 Other localized visual field defect, unspecified eye: Secondary | ICD-10-CM

## 2014-05-31 DIAGNOSIS — R519 Headache, unspecified: Secondary | ICD-10-CM

## 2014-05-31 DIAGNOSIS — R51 Headache: Secondary | ICD-10-CM

## 2014-05-31 LAB — PROLACTIN: Prolactin: 10 ng/mL

## 2014-05-31 NOTE — Telephone Encounter (Signed)
[facility]ax [facility] to [facility].   PSR will notify provider when received.

## 2014-05-31 NOTE — Patient Instructions (Signed)
Book MRI    I will get results of CT neck today.    Prolactin to check pituitary.     Trial of colace 100mg  twice daily for constipation.   Could also try omeprazole OVER THE COUNTER for GASTROESOPHAGEAL REFLUX

## 2014-05-31 NOTE — Telephone Encounter (Signed)
Please get me this patient's CT neck result today from [facility]    Thanks.   Dr. Hiram ComberLaura Daphane Odekirk MD  [facility]  [address], [address]  [place], [place]  [id number]  [facility] [phone]  FAX [phone]

## 2014-05-31 NOTE — Telephone Encounter (Signed)
CT neck result received and scanned into pt record.

## 2014-05-31 NOTE — Progress Notes (Signed)
Reason for visit: follow up visit      Immunizations: Influenza injection (seasonal)  Cervical screening/PAP: no  Mammo: no  Colon Screen: no  Have you seen a specialist since your last visit: Yes    If yes, Name and location and date ENT Dr. Clelia CroftShaw, Hematology Dr. Jeraldine LootsHammond

## 2014-05-31 NOTE — Telephone Encounter (Signed)
Can you print what you see and put this in my inbox.   I still do not see at CT scan.     Thanks.

## 2014-05-31 NOTE — Progress Notes (Signed)
Patient Referred By: No ref. provider found  Patient's PCP: Amaziah Ghosh, Jennefer BravoLaura Jean (General)     Subjective:  Patient is a 43 year old female, here to discuss Follow Up Visit and Throat Problem    The following portions of the patient's history were reviewed with the patient and updated as appropriate: problem list, current medications, allergies, past medical history, past surgical history, past social history and past family history.    HPI Comments: Patient comes in today for follow-up    Patient was seen in the last month on a few occasions with right neck pain.  She has a persistent enlarged right cervical lymph node.  She was quite anxious as well when she was last seen in the office due to a discomfort in her throat with swallowing.  She does describe mucus and postnasal drip which.  Flonase has not helped this throat symptoms.  At her last visit patient had x-ray and routine labs.  Due to the night sweats and lymphadenopathy she also had TB testing and antinuclear antibody test.  Workup has been negative to date.  Patient was referred to ENT for assessment of the throat discomfort and the persistent right cervical lymph node.  Aspiration of this lymph node could not be obtained due to the anatomy in the area and patient did have a CAT scan however results are still not available in the chart.  Patient also saw hematology due to night sweats and other B symptoms.  Hematology ordered investigations were they had recommended patient see a pituitary specialist at [facility].    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      Review of Systems   Constitutional: Positive for activity change, appetite change and fatigue. Negative for unexpected weight change.   HENT: Negative for sore throat.    Musculoskeletal: Negative for joint swelling.   Skin: Negative for rash.   Neurological: Positive for headaches.        Objective:  BP 127/82 mmHg  Pulse 74  Temp(Src) 99.2 F (37.3 C) (Temporal)  Resp 14  Wt 171 lb (77.565 kg)  SpO2 98%   Physical Exam   Constitutional: Vital signs are normal. No distress.   HENT:   Right Ear: Tympanic membrane normal.   Left Ear: Tympanic membrane normal.   Nose: Nose normal.   Mouth/Throat: Uvula is midline, oropharynx is clear and moist and mucous membranes are normal.   Eyes: Conjunctivae are normal.   Neck: Neck supple.       Cardiovascular: Normal rate, regular rhythm and normal heart sounds.    No murmur heard.  Pulmonary/Chest: Effort normal and breath sounds normal. No respiratory distress. She has no wheezes. She has no rales.   Lymphadenopathy:     She has cervical adenopathy.        Right cervical: Superficial cervical adenopathy present.   Neurological: She is alert. She has normal strength. She displays no tremor. No cranial nerve deficit. Coordination and gait normal. GCS eye subscore is 4. GCS verbal subscore is 5. GCS motor subscore is 6.   Reflex Scores:       Bicep reflexes are 2+ on the right side and 2+ on the left side.       Patellar reflexes are 3+ on the right side and 3+ on the left side.  Visual field testing grossly normal bilaterally.  Skin: No rash noted.   Psychiatric: She has a normal mood and affect. Her behavior is normal. Judgment and thought content normal.        Assessment and Plan:   Diagnoses and associated orders for this visit:    Hyponatremia    Night sweats    Intractable headache, unspecified chronicity pattern, unspecified headache type  - PROLACTIN    Abnormal peripheral vision, unspecified laterality      Patient has multiple symptoms coexistent today.  Of concern is her ongoing hyponatremia with headache and night sweats.  Patient describes a possible peripheral vision loss however her examination is grossly normal today in her peripheral fields.  Patient will go ahead and do the MRI which was ordered by her previous primary care provider.   Hematology has recommended patient see a pituitary specialist however a pituitary pathology has not been determined as of yet.  Patient is agreeable to investigate first and do referral based on results.  We'll need to obtain CAT scan results of her neck as this lymph node may possibly still need to be biopsied and could be another issue.    For the ongoing postnasal drip symptoms and mucus in the throat Flonase has been working.  Patient can try over-the-counter omeprazole 20 mg and see if this makes any difference in the next week or so.  Patient does have some general abdominal discomfort since having her gallbladder removed.  She could still he having some post surgical pain however her history does suggest more constipation.  I think Colace 100 mg twice daily for a few weeks with the helpful for this pain.  Discussed the option of ordering an x-ray of the abdomen today however will defer that.  No repeat blood work was obtained today as patient had recent sodium and it was normal.  Order prolactin to assess for pituitary adenoma.    If abnormal MRI and referral to neurosurgery or pituitary specialist at [facility] per patient preference.  If normal MRI consider further pursuing having this right lymph node biopsied.    Follow-up after MRI.    Patient agreeable to plan.   See instructions.   RETURN TO CLINIC if no improvement or change.     (Portions of this note were documented using [employer] which is a Medical illustratorvoice dictation software.  Although edited, some words which may sound like other words could be found in this note.)

## 2014-05-31 NOTE — Telephone Encounter (Signed)
Records printed and placed in Dr. Brain HiltsMontour's box for review.

## 2014-06-01 ENCOUNTER — Telehealth (INDEPENDENT_AMBULATORY_CARE_PROVIDER_SITE_OTHER): Payer: Self-pay | Admitting: Family Medicine

## 2014-06-01 NOTE — Telephone Encounter (Signed)
Patient is asking about a test with contrast you two discussed rather than the MRI ordered. So the test could rule-out more things.    Dr. Conni SlipperAtkin's originally order MRI.    Routing to provider, please advise.

## 2014-06-01 NOTE — Telephone Encounter (Signed)
To referral team, please advise.

## 2014-06-01 NOTE — Telephone Encounter (Signed)
Referral has been faxed. No further details needed, closing TE. Called patient to advise as well.

## 2014-06-01 NOTE — Telephone Encounter (Signed)
CONFIRMED PHONE NUMBER: [phone]  CALLERS FIRST AND LAST NAME: [name]  FACILITY NAME: na TITLE: na  CALLERS RELATIONSHIP:Self  RETURN CALL: Detailed message on voicemail only     SUBJECT: General Message   REASON FOR REQUEST: Referral has not been received by clinic    MESSAGE: Patient called to advise that she had a referral sent to Dr. Bethann [facility]chesBroyles at [facility] (F: [phone]), but the clinic has not received this or any other documentation. Patient asked that the referral be resent, along with any pertinent records/documentation. She is concerned about getting this done soon, as the issuing provider will be leaving for vacation and she wants to be scheduled ASAP. Please follow up with patient to advise once referral is re-sent. Thank you.

## 2014-06-04 ENCOUNTER — Encounter (INDEPENDENT_AMBULATORY_CARE_PROVIDER_SITE_OTHER): Payer: Self-pay | Admitting: Family Medicine

## 2014-06-04 DIAGNOSIS — E871 Hypo-osmolality and hyponatremia: Secondary | ICD-10-CM

## 2014-06-05 ENCOUNTER — Encounter (INDEPENDENT_AMBULATORY_CARE_PROVIDER_SITE_OTHER): Payer: Self-pay | Admitting: Family Medicine

## 2014-06-05 DIAGNOSIS — R1310 Dysphagia, unspecified: Secondary | ICD-10-CM

## 2014-06-05 NOTE — Telephone Encounter (Signed)
Referral processed and [facility]ed to [facility]. No further details needed. Routing to provider as Lorain ChildesFYI.

## 2014-06-05 NOTE — Telephone Encounter (Signed)
Sent ecare message to patient re: cancelling Dr Roxan Hockeyobinson appointment

## 2014-06-05 NOTE — Telephone Encounter (Signed)
[facility] and they indicated they will need a new referral, with the additional pituitary gland imaging request indicated.   Routing to provider to advise.

## 2014-06-05 NOTE — Telephone Encounter (Signed)
New referral placed.

## 2014-06-05 NOTE — Telephone Encounter (Signed)
[facility] requesting to get a PA for scheduled MRI, please advise, it does not look like patient has seen Dr. Roxan Hockeyobinson yet.

## 2014-06-05 NOTE — Telephone Encounter (Signed)
Routed to Referral Team.

## 2014-06-05 NOTE — Telephone Encounter (Signed)
The MRI is with and without contrast.     Staff: [facility]- she is having an MRI BRAIN- please ask if this will adequately assess the pituitary gland. If so, great. If not, please ask what other MRI imaging needs to be ordered to assess the pituitary gland more fully and let me know and I will order    Please help facilitate transfer of imaging once done to Dr. Corrinne EagleBroyle's office

## 2014-06-05 NOTE — Telephone Encounter (Signed)
Noted, please notify patient 

## 2014-06-05 NOTE — Telephone Encounter (Signed)
Patient is requesting that her referral be amended per Dr. Bethann PunchesBroyles requests and that Films be sent to his office afterwards.    Routing to Provider, Routing to Referral Team.

## 2014-06-05 NOTE — Telephone Encounter (Signed)
Called patient to advise that her referral has been updated. No answer, vm left to advise. Also replied to patients e-care message.

## 2014-06-05 NOTE — Telephone Encounter (Signed)
Routing to Referral Team please contact [facility].

## 2014-06-06 NOTE — Telephone Encounter (Signed)
Read ecare message, patient has appointment with Dr Jeraldine LootsHammond and [facility]  Will close this TE

## 2014-06-07 ENCOUNTER — Encounter (INDEPENDENT_AMBULATORY_CARE_PROVIDER_SITE_OTHER): Payer: No Typology Code available for payment source | Admitting: Neurology

## 2014-06-07 ENCOUNTER — Telehealth (INDEPENDENT_AMBULATORY_CARE_PROVIDER_SITE_OTHER): Payer: Self-pay | Admitting: Family Medicine

## 2014-06-07 NOTE — Telephone Encounter (Signed)
This was ordered by Dr. Clydie BraunBhatti, [facility].

## 2014-06-07 NOTE — Telephone Encounter (Signed)
CONFIRMED PHONE NUMBER: [phone]  CALLERS FIRST AND LAST NAME: Malvin JohnsStephanie Jean Doyle  FACILITY NAME: na TITLE: na  CALLERS RELATIONSHIP:Self  RETURN CALL: Detailed message on voicemail only     SUBJECT: General Message   REASON FOR REQUEST: Prior authorization for MRI    MESSAGE: Patient is waiting for authorization from insurance to authorize or MRI please follow up with insurance; patient has an appointment scheduled tomorrow for MRI

## 2014-06-07 NOTE — Telephone Encounter (Signed)
Authorization completed and attached to referral. Called patient to advise as well. No further details. Closing TE.

## 2014-06-13 ENCOUNTER — Encounter (INDEPENDENT_AMBULATORY_CARE_PROVIDER_SITE_OTHER): Payer: No Typology Code available for payment source | Admitting: Family Medicine

## 2014-06-15 ENCOUNTER — Encounter: Payer: Self-pay | Admitting: Family Medicine

## 2014-06-26 ENCOUNTER — Ambulatory Visit (INDEPENDENT_AMBULATORY_CARE_PROVIDER_SITE_OTHER): Payer: Self-pay | Admitting: Family Medicine

## 2014-06-26 ENCOUNTER — Ambulatory Visit (INDEPENDENT_AMBULATORY_CARE_PROVIDER_SITE_OTHER): Payer: No Typology Code available for payment source | Admitting: Family Medicine

## 2014-06-26 ENCOUNTER — Encounter (INDEPENDENT_AMBULATORY_CARE_PROVIDER_SITE_OTHER): Payer: Self-pay | Admitting: Family Medicine

## 2014-06-26 VITALS — BP 126/84 | HR 72 | Temp 97.9°F | Resp 14 | Wt 171.8 lb

## 2014-06-26 DIAGNOSIS — R590 Localized enlarged lymph nodes: Secondary | ICD-10-CM

## 2014-06-26 NOTE — Progress Notes (Signed)
Reason for visit: swollen lymph node    Immunizations: UTD  Cervical screening/PAP: no  Mammo: no  Colon Screen: no  Have you seen a specialist since your last visit: YES    If yes, Name and location and date: GI doctor with Dr. Don BroachStacey Thong on 06/26/14

## 2014-06-26 NOTE — Telephone Encounter (Signed)
CONFIRMED PHONE NUMBER: [phone]   CALLERS FIRST AND LAST NAME: Marcille BlancoStephanie Jean Crowl  FACILITY NAME: N/A TITLE: N/A  CALLERS RELATIONSHIP: Self  RETURN CALL: General message OK     SUBJECT: General Message   REASON FOR REQUEST: Swollen Lymph Nodes    MESSAGE: Pt is experiencing swollen lymph nodes and is requesting a call back to discuss further before scheduling an appt.

## 2014-06-26 NOTE — Telephone Encounter (Signed)
Protocol: LYMPH NODES - SWOLLEN-ADULT-OH  Negative: Sounds like a life-threatening emergency to the triager  Negative: Sore throat is the main symptom and has swollen node in the neck that is < 1 inch (2.5 cm) in size  Negative: Node is in the neck and causes difficulty breathing  Negative: Patient sounds very sick or weak to the triager  Negative: Node is in the neck and can't swallow fluids  Negative: Fever > 103 F (39.4 C)  Negative: Lump or swelling in groin and pulsating (like heartbeat)  Negative: Single large node and size > 1 inch (2.5 cm)  Negative: Overlying skin is red  Negative: Rapid increase in size of node over several hours  Negative: Tender node in the groin and has a sore, scratch, cut, or painful red area on that leg  Negative: Tender node in the armpit and has a sore, scratch, cut, or painful red area on that arm  Negative: Tender node in the neck and also has a sore throat with minimal/no runny nose or cough  Negative: Fever present > 3 days (72 hours)  Affirmative: Large nodes at multiple locations  Disposition of See Today or Tomorrow in Office  suggested.  Patient states that lymph nodes on right side of neck and under right arm have become more (moderate) tender to touch and are more swollen in the past several days than they have been for the past two months (evaluated by Dr. Ninfa MeekerMontour 05/31/14).  Patient agrees to be evaluated by Dr. Ninfa MeekerMontour today at 1700 and call back if symptoms worsen.

## 2014-06-26 NOTE — Telephone Encounter (Signed)
Routing to [facility] Triage RN

## 2014-06-29 ENCOUNTER — Telehealth (INDEPENDENT_AMBULATORY_CARE_PROVIDER_SITE_OTHER): Payer: Self-pay | Admitting: Family Medicine

## 2014-06-29 ENCOUNTER — Encounter (INDEPENDENT_AMBULATORY_CARE_PROVIDER_SITE_OTHER): Payer: Self-pay | Admitting: Family Medicine

## 2014-06-29 ENCOUNTER — Other Ambulatory Visit (INDEPENDENT_AMBULATORY_CARE_PROVIDER_SITE_OTHER): Payer: Self-pay | Admitting: Family Medicine

## 2014-06-29 DIAGNOSIS — R590 Localized enlarged lymph nodes: Secondary | ICD-10-CM

## 2014-06-29 LAB — CBC, DIFF
% Basophils: 0 %
% Eosinophils: 1 %
% Immature Granulocytes: 0 %
% Lymphocytes: 34 %
% Monocytes: 8 %
% Neutrophils: 57 %
Absolute Eosinophil Count: 0.09 10*3/uL (ref 0.00–0.50)
Absolute Lymphocyte Count: 2.57 10*3/uL (ref 1.00–4.80)
Basophils: 0.03 10*3/uL (ref 0.00–0.20)
Hematocrit: 42 % (ref 36–45)
Hemoglobin: 14.3 g/dL (ref 11.5–15.5)
Immature Granulocytes: 0.01 10*3/uL (ref 0.00–0.05)
MCH: 30.1 pg (ref 27.3–33.6)
MCHC: 33.7 g/dL (ref 32.2–36.5)
MCV: 89 fL (ref 81–98)
Monocytes: 0.58 10*3/uL (ref 0.00–0.80)
Neutrophils: 4.24 10*3/uL (ref 1.80–7.00)
Platelet Count: 234 10*3/uL (ref 150–400)
RBC: 4.75 mil/uL (ref 3.80–5.00)
RDW-CV: 12.8 % (ref 11.6–14.4)
WBC: 7.52 10*3/uL (ref 4.3–10.0)

## 2014-06-29 NOTE — Telephone Encounter (Signed)
Patient responded routing to provider, please advise.

## 2014-06-29 NOTE — Telephone Encounter (Signed)
Patient has been contacted and also mailed a letter to schedule her appointment and have received no responses yet. Detailed VM has been left with contact details to schedule her mammogram.    CCR/PSR- if patient calls back please advise it is urgent for her to schedule her mammogram as soon as possible at [phone].    Routing to provider as Lorain ChildesFYI

## 2014-06-29 NOTE — Progress Notes (Signed)
Patient Referred By: No ref. provider found  Patient's PCP: Yumi Insalaco, Jennefer BravoLaura Jean (General)     Subjective:  Patient is a 43 year old female, here to discuss Follow-Up     The following portions of the patient's history were reviewed with the patient and updated as appropriate: problem list, current medications, allergies, past medical history, past surgical history, past social history and past family history.    HPI Comments: Patient's here today for swelling under the right arm    Patient has had swelling under the right arm for several weeks now.  Worsening swelling in the last week.  Patient called the nurse triage and was recommended to come in for assessment.  Patient denies any fever or chills.  She has had swelling of lymph nodes also previously in the neck.  Hematology has seen patient recently.  Pain level is a 10 out of 10 for pain today.  Patient denies any breast lumps or discomfort.  Pain is specifically at the area of swelling under the armpit.    Patient has a complex history of symptoms going back at least a year however they seem to be worsening.  Specifically she was having night sweats, headaches and hyponatremia.  It was recommended that she see a pituitary specialist as hematology thought that there could maybe be a hormonal problem.  MRI of the brain was normal.  Patient did have a screening mammogram in October which did show a density in the right breast.    There is a family history of breast cancer in the family and cancer in general.  Patient is a nonsmoker      Review of Systems   Constitutional: Positive for fatigue. Negative for fever and chills.   Respiratory: Negative for cough.    Cardiovascular: Negative for chest pain.   Skin: Negative for color change.   Neurological: Negative for weakness and numbness.         Objective:  BP 126/84 mmHg  Pulse 72  Temp(Src) 97.9 F (36.6 C) (Temporal)  Resp 14  Wt 171 lb 12.8 oz (77.928 kg)  SpO2 100%    Physical Exam   Constitutional:  Vital signs are normal. No distress.   Pulmonary/Chest: Right breast exhibits no inverted nipple, no mass, no nipple discharge, no skin change and no tenderness. Left breast exhibits no inverted nipple, no mass, no nipple discharge, no skin change and no tenderness. Breasts are asymmetrical (fullness to the right axilla).   Lymphadenopathy:     She has axillary adenopathy.        Right axillary: Lateral adenopathy present.        Assessment and Plan:   Diagnoses and associated orders for this visit:    Axillary lymphadenopathy  Rule out pathology and breast.  Patient recently had a test x-ray so we'll hold off on repeating this.  Patient does have an existing prescription for Vicodin for pain.  She can use this as needed for pain.  Patient should return to clinic if worse or change.  - MAMMOGRAM, ONE BREAST DIAGNOSTIC  - CBC, DIFF; Future        Patient agreeable to plan.   See instructions.   RETURN TO CLINIC if no improvement or change.     (Portions of this note were documented using [employer] which is a Medical illustratorvoice dictation software.  Although edited, some words which may sound like other words could be found in this note.)

## 2014-06-29 NOTE — Telephone Encounter (Signed)
Labs drawn today. Routing to Monday to fax labs once resulted.

## 2014-06-29 NOTE — Telephone Encounter (Signed)
Iris,     Can you follow up and make sure she gets her breast imaging done.  She is has enlarged lymph nodes under her right arm and needs a diagnostic mammogram and ultrasound.     Dr. Hiram ComberLaura Silver Parkey MD  [facility]  [address], [address]  [place], [place]  [id number]  [facility] [phone]  FAX [phone]

## 2014-06-29 NOTE — Telephone Encounter (Signed)
Please fax CBC results as FYI [facility] Dr. Rogelio SeenStacy Tong.    Dr. Hiram ComberLaura Durrell Barajas MD  [facility]  [address], [address]  [place], [place]  [id number]  [facility] [phone]  FAX [phone]

## 2014-06-29 NOTE — Telephone Encounter (Signed)
Please see e-care message from patient. She states that her arm pit swelling feels worse and is wondering if she should get an ultrasound. Notes from 06/26/14 visit are not completed yet. Routing to provider, please advise.

## 2014-07-02 NOTE — Telephone Encounter (Signed)
CBC Final result in Epic, reviewed and released by provider.    Routed to Dr. Rogelio SeenStacy Tong.    No further action required.    Close encounter.

## 2014-07-05 ENCOUNTER — Encounter (INDEPENDENT_AMBULATORY_CARE_PROVIDER_SITE_OTHER): Payer: No Typology Code available for payment source | Admitting: Family Medicine

## 2014-07-05 ENCOUNTER — Ambulatory Visit (HOSPITAL_BASED_OUTPATIENT_CLINIC_OR_DEPARTMENT_OTHER): Payer: No Typology Code available for payment source

## 2014-07-05 ENCOUNTER — Other Ambulatory Visit (INDEPENDENT_AMBULATORY_CARE_PROVIDER_SITE_OTHER): Payer: Self-pay | Admitting: Family Medicine

## 2014-07-05 ENCOUNTER — Ambulatory Visit: Payer: No Typology Code available for payment source | Attending: Family Medicine

## 2014-07-05 ENCOUNTER — Telehealth (INDEPENDENT_AMBULATORY_CARE_PROVIDER_SITE_OTHER): Payer: Self-pay | Admitting: Family Medicine

## 2014-07-05 DIAGNOSIS — N63 Unspecified lump in breast: Secondary | ICD-10-CM | POA: Insufficient documentation

## 2014-07-05 NOTE — Telephone Encounter (Signed)
Patient called, saying that the Radiologist said she may have a DVT in the Arm. But it wasn't his expertise and she should follow up with Dr. Ninfa MeekerMontour about it today.    I spoke to Dr. Ninfa MeekerMontour and she directed that patient go directly to the ER. Patient [facility].  [facility] and [facility]: Misty StanleyLisa,    I explained what was going on and patient was in transit.    Routing to provider.

## 2014-07-05 NOTE — Telephone Encounter (Signed)
I spoke to Dr. Ninfa MeekerMontour

## 2014-07-09 DIAGNOSIS — M79621 Pain in right upper arm: Secondary | ICD-10-CM

## 2014-07-09 DIAGNOSIS — Z09 Encounter for follow-up examination after completed treatment for conditions other than malignant neoplasm: Secondary | ICD-10-CM

## 2014-07-09 DIAGNOSIS — M7989 Other specified soft tissue disorders: Secondary | ICD-10-CM

## 2014-07-10 ENCOUNTER — Encounter (INDEPENDENT_AMBULATORY_CARE_PROVIDER_SITE_OTHER): Payer: Self-pay

## 2014-07-21 ENCOUNTER — Encounter (INDEPENDENT_AMBULATORY_CARE_PROVIDER_SITE_OTHER): Payer: Self-pay | Admitting: Family Medicine

## 2014-07-30 ENCOUNTER — Encounter (INDEPENDENT_AMBULATORY_CARE_PROVIDER_SITE_OTHER): Payer: Self-pay | Admitting: Family Medicine

## 2014-08-16 ENCOUNTER — Ambulatory Visit (HOSPITAL_BASED_OUTPATIENT_CLINIC_OR_DEPARTMENT_OTHER)
Payer: No Typology Code available for payment source | Attending: Hematology & Oncology | Admitting: Hematology & Oncology

## 2014-08-16 DIAGNOSIS — M79621 Pain in right upper arm: Secondary | ICD-10-CM | POA: Insufficient documentation

## 2014-08-16 DIAGNOSIS — M7989 Other specified soft tissue disorders: Secondary | ICD-10-CM | POA: Insufficient documentation

## 2014-08-16 DIAGNOSIS — M542 Cervicalgia: Secondary | ICD-10-CM | POA: Insufficient documentation

## 2014-08-16 DIAGNOSIS — R591 Generalized enlarged lymph nodes: Secondary | ICD-10-CM

## 2014-08-16 DIAGNOSIS — R61 Generalized hyperhidrosis: Secondary | ICD-10-CM | POA: Insufficient documentation

## 2014-08-16 DIAGNOSIS — R208 Other disturbances of skin sensation: Secondary | ICD-10-CM | POA: Insufficient documentation

## 2014-08-16 DIAGNOSIS — R634 Abnormal weight loss: Secondary | ICD-10-CM | POA: Insufficient documentation

## 2014-08-31 ENCOUNTER — Encounter (HOSPITAL_BASED_OUTPATIENT_CLINIC_OR_DEPARTMENT_OTHER): Payer: No Typology Code available for payment source | Admitting: Hematology & Oncology

## 2014-09-07 ENCOUNTER — Ambulatory Visit (HOSPITAL_BASED_OUTPATIENT_CLINIC_OR_DEPARTMENT_OTHER)
Payer: No Typology Code available for payment source | Attending: Hematology & Oncology | Admitting: Hematology & Oncology

## 2014-09-07 ENCOUNTER — Ambulatory Visit (HOSPITAL_BASED_OUTPATIENT_CLINIC_OR_DEPARTMENT_OTHER): Payer: No Typology Code available for payment source

## 2014-09-07 DIAGNOSIS — M25511 Pain in right shoulder: Secondary | ICD-10-CM | POA: Insufficient documentation

## 2014-09-07 DIAGNOSIS — N644 Mastodynia: Secondary | ICD-10-CM

## 2014-09-07 DIAGNOSIS — M79621 Pain in right upper arm: Secondary | ICD-10-CM | POA: Insufficient documentation

## 2014-09-07 DIAGNOSIS — R591 Generalized enlarged lymph nodes: Secondary | ICD-10-CM

## 2014-09-07 DIAGNOSIS — R208 Other disturbances of skin sensation: Secondary | ICD-10-CM | POA: Insufficient documentation

## 2014-09-07 DIAGNOSIS — M7989 Other specified soft tissue disorders: Secondary | ICD-10-CM | POA: Insufficient documentation

## 2014-09-07 DIAGNOSIS — G43909 Migraine, unspecified, not intractable, without status migrainosus: Secondary | ICD-10-CM | POA: Insufficient documentation

## 2014-09-07 DIAGNOSIS — R61 Generalized hyperhidrosis: Secondary | ICD-10-CM | POA: Insufficient documentation

## 2014-09-09 DIAGNOSIS — E222 Syndrome of inappropriate secretion of antidiuretic hormone: Secondary | ICD-10-CM | POA: Insufficient documentation

## 2014-09-09 DIAGNOSIS — S069XAA Unspecified intracranial injury with loss of consciousness status unknown, initial encounter: Secondary | ICD-10-CM | POA: Insufficient documentation

## 2014-09-09 DIAGNOSIS — S069X9A Unspecified intracranial injury with loss of consciousness of unspecified duration, initial encounter: Secondary | ICD-10-CM | POA: Insufficient documentation

## 2014-09-20 ENCOUNTER — Other Ambulatory Visit (HOSPITAL_BASED_OUTPATIENT_CLINIC_OR_DEPARTMENT_OTHER): Payer: Self-pay

## 2014-09-21 ENCOUNTER — Encounter (HOSPITAL_BASED_OUTPATIENT_CLINIC_OR_DEPARTMENT_OTHER): Payer: No Typology Code available for payment source | Admitting: Hematology & Oncology

## 2014-09-25 ENCOUNTER — Encounter (HOSPITAL_BASED_OUTPATIENT_CLINIC_OR_DEPARTMENT_OTHER): Payer: No Typology Code available for payment source | Admitting: Hematology & Oncology

## 2014-09-27 ENCOUNTER — Ambulatory Visit (HOSPITAL_BASED_OUTPATIENT_CLINIC_OR_DEPARTMENT_OTHER)
Payer: No Typology Code available for payment source | Attending: Hematology & Oncology | Admitting: Hematology & Oncology

## 2014-09-27 DIAGNOSIS — R61 Generalized hyperhidrosis: Secondary | ICD-10-CM | POA: Insufficient documentation

## 2014-09-27 DIAGNOSIS — N644 Mastodynia: Secondary | ICD-10-CM

## 2014-09-27 DIAGNOSIS — M7989 Other specified soft tissue disorders: Secondary | ICD-10-CM | POA: Insufficient documentation

## 2014-09-27 DIAGNOSIS — R591 Generalized enlarged lymph nodes: Secondary | ICD-10-CM

## 2014-09-27 DIAGNOSIS — M79621 Pain in right upper arm: Secondary | ICD-10-CM | POA: Insufficient documentation

## 2014-09-27 DIAGNOSIS — R208 Other disturbances of skin sensation: Secondary | ICD-10-CM | POA: Insufficient documentation

## 2014-09-28 ENCOUNTER — Encounter (INDEPENDENT_AMBULATORY_CARE_PROVIDER_SITE_OTHER): Payer: Self-pay | Admitting: Family Medicine

## 2014-09-28 NOTE — Telephone Encounter (Signed)
I can do her pap, I would recommend she book a follow up anyway before ordering tests.   Book her for 45 minutes please, she is very complex.     Thanks.   Dr. Hiram ComberLaura Bryana Froemming MD  [facility]  [address], [address]  [place], [place]  [id number]  [facility] [phone]  FAX [phone]

## 2014-09-28 NOTE — Telephone Encounter (Signed)
Routing to frond desk to schedule pap for patient. 45 minute appointment needed.

## 2014-09-28 NOTE — Telephone Encounter (Signed)
Please see ecare message from patient. Routing to provider, please advise.

## 2014-09-29 NOTE — Telephone Encounter (Signed)
LVM for Patient to schedule pap Appointment with Dr Ninfa MeekerMontour. 45 min Appointment needed. Will CB on Tuesday if Patient has not called to schedule.    CCR/PSR: Please schedule Appointment with Dr Ninfa MeekerMontour for 45 min Appointment at patient convenience.

## 2014-10-01 NOTE — Telephone Encounter (Signed)
Patient rescheduled w/Dr. Ninfa MeekerMontour for 03/09 @ 2:30PM for 45 min.  Routing to [employer]    Closing TE.

## 2014-10-01 NOTE — Telephone Encounter (Signed)
Patient booked for 15 min only.  I need 45 min    Please rebook.

## 2014-10-02 ENCOUNTER — Encounter (INDEPENDENT_AMBULATORY_CARE_PROVIDER_SITE_OTHER): Payer: No Typology Code available for payment source | Admitting: Family Medicine

## 2014-10-03 ENCOUNTER — Ambulatory Visit (INDEPENDENT_AMBULATORY_CARE_PROVIDER_SITE_OTHER): Payer: No Typology Code available for payment source | Admitting: Family Medicine

## 2014-10-03 ENCOUNTER — Encounter (INDEPENDENT_AMBULATORY_CARE_PROVIDER_SITE_OTHER): Payer: Self-pay | Admitting: Family Medicine

## 2014-10-03 VITALS — BP 127/85 | HR 80 | Temp 98.5°F | Resp 12 | Wt 174.0 lb

## 2014-10-03 DIAGNOSIS — R102 Pelvic and perineal pain: Secondary | ICD-10-CM

## 2014-10-03 DIAGNOSIS — E222 Syndrome of inappropriate secretion of antidiuretic hormone: Secondary | ICD-10-CM

## 2014-10-03 DIAGNOSIS — M7989 Other specified soft tissue disorders: Secondary | ICD-10-CM

## 2014-10-03 DIAGNOSIS — Z87410 Personal history of cervical dysplasia: Secondary | ICD-10-CM

## 2014-10-03 NOTE — Progress Notes (Signed)
2:39 PM    Reason for visit: Referral      Immunizations: UTD  Cervical screening/PAP: no  Mammo: no  Colon Screen: no  Have you seen a specialist since your last visit: NO    If yes, Name and location and date:

## 2014-10-03 NOTE — Patient Instructions (Addendum)
Book pelvic ultrasound and vascular specialist.     It looks like your neurologist is awaiting approval for the medication.  Follow up with [employer] to find out about coverage.      Results of ultrasound on ecare.          PLAN:   1. 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 Euvolemic on exam. Certainly could be a consequence of her previous head injury. It is unclear the duration of this abnormality. I have advised her to be cautious about excessive free water intake especially during illness. I've also asked her to have a low threshold to have a sodium level drawn when she is feeling poorly. She is not hypothyroid nor does she have adrenal insufficiency. I do not believe she needs to fluid restrict chronically or be on salt tablets. Sodium testing done by us repeatedly has been normal. MRI of the pituitary was negative November 2015.   2. Pituitary function:  a. Adrenal axis: Cortrosyn stimulation test was normal. No further testing at this time.   b. Thyroid axis: Thyroid testing is normal including negative thyroid antibodies.  c. Gonadal axis: LH and FSH are low consistent with long-term noncycling oral contraceptive use.Given her night sweats we may wish to take her off the oral contraceptive for at least 3 months to see if her periods resume.We will hold off on this at the current time.  d. Growth hormone axis: IGF one levels have been low normal. Growth hormone stimulation testing was normal. However given her traumatic brain injury, I think it would be important do a trial of growth hormone therapy. Given that there is data to state that traumatic brain injuries associated with abnormal pulsatile secretion of growth hormone.  3. Hormone excess:   a. Prolactin: Normal.  b. Cushing's: No testing indicated at this time.  c. Acromegaly: Low normal, see the discussion above.  4. Screening for diabetes insipidus: No signs or symptoms to warrant a water deprivation test. 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  5. Fatigue. Most likely  multifactorial.Shortly could be from traumatic brain injury.  6. Elevated high sensitivity C reactive protein And sed rate. Family history of rheumatoid arthritis. Possibly see rheumatology. She will discuss with Dr. Jeraldine LootsHammond.  7. Traumatic brain injury/migraines. I think it would be helpful to see neurology and potentially do neurocognitive testing. It would be nice to get this is a baseline for that we can track this.  8. Long-term follow up will be determined based on subsequent testing.  9. We will call the patient back in about a week and review with her any outstanding test results. We will then send a letter to her referring provider outlining the results and treatment plan.  I spent 50 minutes with this patient greater than 50% was spent in counseling and coordination of care  Sincerely,    Lucile ShuttersFrances Broyles, M.D.  Director, [facility]  [facility]    Adline Mangoarker, Michelle R - 09/12/2014 1:07 PM PST  09/12/2014-Init[facility]. Filled out [employer] printed out chart notes, labs, and insurance but no card available. MP

## 2014-10-04 ENCOUNTER — Telehealth (INDEPENDENT_AMBULATORY_CARE_PROVIDER_SITE_OTHER): Payer: Self-pay | Admitting: Surgery

## 2014-10-04 NOTE — Telephone Encounter (Signed)
NP Paperwork mailed    Diagnosis: Swelling R Armpit    Who is referring you? Dr. Hiram ComberLaura Montour    Who is your PCP?   Same    What other specialists have you seen?   Dr. Cliffton AstersWhite Gallbladder, Dr. Jeraldine LootsHammond (check for lymphoma, etc.)    What surgeons have you seen?  Same       Have you had radiology studies done?

## 2014-10-05 NOTE — Progress Notes (Signed)
Patient Referred By: No ref. provider found  Patient's PCP: Hiram Comber, MD     Subjective:  Patient is a 44 year old female, here to discuss Referral    The following portions of the patient's history were reviewed with the patient and updated as appropriate: problem list, current medications, allergies, past medical history, past surgical history, past social history and past family history.    HPI Comments: Patient here for follow up    1. Right axilla/arm pain  Issue going on now for a few months. Worsening.  Patient having pain now wearing a bra over the area.   Pain as much sat 8/10 with a bra.  Patient described as sharp/stabbing.  Seen by hematology and mammogram obtained.  No signs of lymphoma or breast cancer.  Swelling and pain all seemed to start after a Picc line inserted.  This was inserted due to severe hyponatremia.      2. Abnormal pap.   Last pap 2015 with HPV was normal.  Monogamous relationship.  Abnormal pap over 15 years ago.  Patient would like to be check yearly.     3. Pelvic pain and anterior thigh pain.   New issue in the last month.  Patient concerned this could be related to arm and lymph nodes.   No trauma.  Patient feels the upper thighs are looking swollen.     4. Hyponatremia  Seen by endocrinology.  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        Review of Systems   Constitutional: Negative for fever and chills.   Genitourinary: Negative for vaginal discharge and menstrual problem.   Musculoskeletal: Negative for joint swelling.         Objective:  BP 127/85 mmHg  Pulse 80  Temp(Src) 98.5 F (36.9 C) (Temporal)  Resp 12  Wt 174 lb (78.926 kg)  SpO2 99%   Physical Exam   Constitutional: No distress.   Genitourinary: Vagina normal. There is no rash, tenderness, lesion or injury on the right labia. There is no rash, tenderness, lesion or injury on the left labia. Cervix exhibits no motion tenderness, no discharge and no friability. Right adnexum displays  tenderness. Left adnexum displays no mass.   Musculoskeletal:        Right upper leg: Normal.        Left upper leg: Normal.   Nursing note and vitals reviewed.       Assessment and Plan:   Diagnoses and all orders for this visit:    Swelling in right armpit  Orders:  -     REFERRAL TO VASCULAR SURGERY    Arm swelling  Orders:  -     REFERRAL TO VASCULAR SURGERY    Swelling in right arm and axilla since Fall.  Started after Picc line.  Concern for lymphadema vs. Vascular cause.  Hematology saw patient and no lymphadenopathy or concern for lymphoma.  Will do referral to vascular or opinion on further testing.      Pelvic pain in female  Reassured exam does not suggest an mass.  Due to tenderness right lower quadrant will order ultrasound to assess.  With patient complaining of leg pain radiating down thighs concern about possible endometriosis.  Consider gyne referral pending ultrasound results.    Orders:  -     US EXAM, PELVIC, COMPLETE    Personal history of cervical dysplasia  Reassured cervix normal.  History of abnormal pap was over 15 years ago and  pap last year with HPV was negative.      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            Patient agreeable to plan.   See instructions.   RETURN TO CLINIC if no improvement or change.     (Portions of this note were documented using [employer] which is a Medical illustratorvoice dictation software.  Although edited, some words which may sound like other words could be found in this note.)

## 2014-10-09 ENCOUNTER — Other Ambulatory Visit (INDEPENDENT_AMBULATORY_CARE_PROVIDER_SITE_OTHER): Payer: Self-pay | Admitting: Family Medicine

## 2014-10-09 DIAGNOSIS — N858 Other specified noninflammatory disorders of uterus: Secondary | ICD-10-CM

## 2014-10-10 NOTE — Telephone Encounter (Signed)
Dr. Hiram ComberLaura Montour & Dr. Jeraldine LootsHammond on Epic     Referral note from Dr. Ninfa MeekerMontour:   REFERRAL TO VASCULAR SURGERY  Swelling in right arm and axilla since Fall. Started after Picc line. Concern for lymphadema vs. Vascular cause. Hematology saw patient and no lymphadenopathy or concern for lymphoma. Will do referral to vascular or opinion on further testing.     Printed :Vasc Venous upper Ext. 07/05/14   CT Neck Soft tissue 08/29/14  CT chest 08/29/14  MRI Forearm 2.25/16

## 2014-10-12 ENCOUNTER — Ambulatory Visit (INDEPENDENT_AMBULATORY_CARE_PROVIDER_SITE_OTHER): Payer: No Typology Code available for payment source | Admitting: Surgery

## 2014-10-12 VITALS — BP 126/76 | HR 77 | Ht 62.0 in | Wt 168.0 lb

## 2014-10-12 DIAGNOSIS — M7989 Other specified soft tissue disorders: Secondary | ICD-10-CM

## 2014-10-26 ENCOUNTER — Ambulatory Visit (INDEPENDENT_AMBULATORY_CARE_PROVIDER_SITE_OTHER): Payer: No Typology Code available for payment source | Admitting: Obstetrics & Gynecology

## 2014-10-26 VITALS — BP 125/79 | HR 80 | Temp 98.2°F | Resp 12 | Wt 172.0 lb

## 2014-10-26 DIAGNOSIS — R1030 Lower abdominal pain, unspecified: Secondary | ICD-10-CM

## 2014-10-26 DIAGNOSIS — N9489 Other specified conditions associated with female genital organs and menstrual cycle: Secondary | ICD-10-CM

## 2014-10-26 DIAGNOSIS — R1031 Right lower quadrant pain: Secondary | ICD-10-CM

## 2014-10-26 DIAGNOSIS — R1032 Left lower quadrant pain: Secondary | ICD-10-CM

## 2014-10-26 DIAGNOSIS — R102 Pelvic and perineal pain: Secondary | ICD-10-CM

## 2014-10-26 NOTE — Progress Notes (Signed)
Reason for Visit: [facility]: 44 year old female pelvic pain and fullness right adnexa. Ultrasound showing sessile polyp. I would appreciate your opinion about whether this could be causing pain     She also has soreness left groin area; feels like maybe lymph node discomfort.    See last gyn exams with Dr.Shope on 10/27/11 & 09/08/10.    Refills? NO  Referral? NO  Letter or Form? NO  Lab Results? NO    HEALTH MAINTENANCE:  Has the patient has this done since their last visit?  Cervical screening/PAP: due 11/23/14  Mammo: UTD  Colon Screen: N/A    Have you seen a specialist since your last visit: No    Vaccines Due? No    Does patient have eCare?  Yes    HM Due:   Health Maintenance   Topic Date Due   . Pap Smear  11/23/2014   . Tetanus Vaccine  11/23/2023   . HIV Screen  Completed       PCP Verified?  Yes, Montour, Vernona RiegerLaura, MD
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
## 2014-10-29 ENCOUNTER — Encounter (INDEPENDENT_AMBULATORY_CARE_PROVIDER_SITE_OTHER): Payer: Self-pay | Admitting: Surgery

## 2014-10-29 DIAGNOSIS — M7989 Other specified soft tissue disorders: Secondary | ICD-10-CM | POA: Insufficient documentation
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
## 2014-10-30 NOTE — Addendum Note (Signed)
Addended by: Hosie SpangleBUCKHOLDT, MARGARET on: 10/30/2014 01:58 PM     Modules accepted: Level of Service

## 2014-11-03 ENCOUNTER — Encounter (INDEPENDENT_AMBULATORY_CARE_PROVIDER_SITE_OTHER): Payer: Self-pay | Admitting: Family Medicine

## 2014-12-19 ENCOUNTER — Telehealth (INDEPENDENT_AMBULATORY_CARE_PROVIDER_SITE_OTHER): Payer: Self-pay | Admitting: Unknown Physician Specialty

## 2014-12-19 NOTE — Telephone Encounter (Signed)
LEFT MESSAGE TO CALL BACK patient seen in ER on 12/12/14    CCR: please offer appointment if patient would like to come in and follow up with PCP

## 2014-12-20 NOTE — Telephone Encounter (Signed)
LVM for patient to call back for ED status check. Second attempt.     CCR: Please offer patient appointment with PCP for ED follow up. Transfer to [phone], if any questions. Thank you.

## 2015-01-24 ENCOUNTER — Ambulatory Visit (HOSPITAL_BASED_OUTPATIENT_CLINIC_OR_DEPARTMENT_OTHER): Payer: No Typology Code available for payment source

## 2015-01-31 ENCOUNTER — Encounter (INDEPENDENT_AMBULATORY_CARE_PROVIDER_SITE_OTHER): Payer: Self-pay | Admitting: Obstetrics & Gynecology

## 2015-01-31 ENCOUNTER — Ambulatory Visit: Payer: No Typology Code available for payment source | Attending: Obstetrics & Gynecology

## 2015-01-31 DIAGNOSIS — N9489 Other specified conditions associated with female genital organs and menstrual cycle: Secondary | ICD-10-CM | POA: Insufficient documentation

## 2015-02-27 ENCOUNTER — Other Ambulatory Visit (INDEPENDENT_AMBULATORY_CARE_PROVIDER_SITE_OTHER): Payer: Self-pay | Admitting: Family Medicine

## 2015-02-27 DIAGNOSIS — IMO0001 Reserved for inherently not codable concepts without codable children: Secondary | ICD-10-CM

## 2015-02-27 DIAGNOSIS — M62838 Other muscle spasm: Secondary | ICD-10-CM

## 2015-02-27 NOTE — Telephone Encounter (Signed)
Patient has switched and established care at [facility].  Routing to [place] [facility]

## 2015-02-28 MED ORDER — CYCLOBENZAPRINE HCL 10 MG OR TABS
10.0000 mg | ORAL_TABLET | Freq: Three times a day (TID) | ORAL | Status: DC | PRN
Start: 2015-02-28 — End: 2015-07-16

## 2015-02-28 MED ORDER — NORETHINDRONE-ETH ESTRADIOL 0.5-35 MG-MCG OR TABS
ORAL_TABLET | ORAL | Status: DC
Start: 2015-02-28 — End: 2015-07-16

## 2015-02-28 NOTE — Telephone Encounter (Signed)
Requested: Cyclobenzaprine    - Medication not within [facility] protocol    Please sign and close the encounter if you approve.  If this medication is denied please have your staff inform the patient and schedule an appointment if necessary.

## 2015-02-28 NOTE — Telephone Encounter (Signed)
Routing to [facility].

## 2015-06-07 ENCOUNTER — Other Ambulatory Visit (INDEPENDENT_AMBULATORY_CARE_PROVIDER_SITE_OTHER): Payer: Self-pay

## 2015-06-07 NOTE — Progress Notes (Signed)
Care Management Needs Review    Patient's chart was reviewed by RN for possible care management needs.    Recommendations for possible care management needs include:  1. Has not been seen by PCP in over 6 months, no wellness visit on file    Recommendations were reviewed with PCP:  YES, ecare message sent to PCP notifying of outreach    Plan:  ecare message sent introducing care management team and offering PCP OV for general wellness visit

## 2015-06-17 ENCOUNTER — Encounter (INDEPENDENT_AMBULATORY_CARE_PROVIDER_SITE_OTHER): Payer: Self-pay

## 2015-06-17 ENCOUNTER — Other Ambulatory Visit (INDEPENDENT_AMBULATORY_CARE_PROVIDER_SITE_OTHER): Payer: Self-pay

## 2015-06-17 DIAGNOSIS — N6310 Unspecified lump in the right breast, unspecified quadrant: Secondary | ICD-10-CM

## 2015-06-17 NOTE — Progress Notes (Signed)
Care management introduction second outreach. Ecare sent

## 2015-06-25 NOTE — Telephone Encounter (Signed)
Advised pt that [facility]  Gave pt the contact number to schedule.

## 2015-06-26 ENCOUNTER — Other Ambulatory Visit: Payer: Self-pay | Admitting: Family Medicine

## 2015-06-26 DIAGNOSIS — N63 Unspecified lump in unspecified breast: Secondary | ICD-10-CM

## 2015-06-26 NOTE — Telephone Encounter (Signed)
Pt has read ecare message  Closing encounter

## 2015-06-30 ENCOUNTER — Encounter (INDEPENDENT_AMBULATORY_CARE_PROVIDER_SITE_OTHER): Payer: Self-pay | Admitting: Family Medicine

## 2015-07-03 ENCOUNTER — Ambulatory Visit (HOSPITAL_BASED_OUTPATIENT_CLINIC_OR_DEPARTMENT_OTHER): Payer: No Typology Code available for payment source

## 2015-07-03 ENCOUNTER — Ambulatory Visit: Payer: No Typology Code available for payment source | Attending: Family Medicine

## 2015-07-03 DIAGNOSIS — N63 Unspecified lump in breast: Secondary | ICD-10-CM | POA: Insufficient documentation

## 2015-07-16 ENCOUNTER — Other Ambulatory Visit (INDEPENDENT_AMBULATORY_CARE_PROVIDER_SITE_OTHER): Payer: Self-pay | Admitting: Family Medicine

## 2015-07-16 DIAGNOSIS — M62838 Other muscle spasm: Secondary | ICD-10-CM

## 2015-07-16 DIAGNOSIS — IMO0001 Reserved for inherently not codable concepts without codable children: Secondary | ICD-10-CM

## 2015-07-16 MED ORDER — NORETHINDRONE-ETH ESTRADIOL 0.5-35 MG-MCG OR TABS
ORAL_TABLET | ORAL | Status: DC
Start: 2015-07-16 — End: 2015-12-25

## 2015-07-16 MED ORDER — CYCLOBENZAPRINE HCL 10 MG OR TABS
10.0000 mg | ORAL_TABLET | Freq: Three times a day (TID) | ORAL | Status: DC | PRN
Start: 2015-07-16 — End: 2016-06-22

## 2015-07-16 NOTE — Telephone Encounter (Signed)
Routing to [facility].

## 2015-07-16 NOTE — Telephone Encounter (Signed)
This medication is outside of the [facility]'s protocols. Please sign and close the encounter if you approve: Cyclobenzaprine 10mg     If this medication is denied please have your staff inform the patient and schedule an appointment if necessary.

## 2015-08-04 ENCOUNTER — Ambulatory Visit (INDEPENDENT_AMBULATORY_CARE_PROVIDER_SITE_OTHER): Payer: No Typology Code available for payment source | Admitting: Family Medicine

## 2015-08-04 ENCOUNTER — Encounter (INDEPENDENT_AMBULATORY_CARE_PROVIDER_SITE_OTHER): Payer: Self-pay | Admitting: Family Medicine

## 2015-08-04 VITALS — BP 128/77 | HR 96 | Temp 98.6°F | Resp 12 | Ht 61.5 in | Wt 182.1 lb

## 2015-08-04 DIAGNOSIS — R059 Cough, unspecified: Secondary | ICD-10-CM

## 2015-08-04 DIAGNOSIS — R05 Cough: Secondary | ICD-10-CM

## 2015-08-04 DIAGNOSIS — J40 Bronchitis, not specified as acute or chronic: Secondary | ICD-10-CM

## 2015-08-04 DIAGNOSIS — R0981 Nasal congestion: Secondary | ICD-10-CM

## 2015-08-04 MED ORDER — BENZONATATE 100 MG OR CAPS
100.0000 mg | ORAL_CAPSULE | Freq: Three times a day (TID) | ORAL | Status: DC | PRN
Start: 2015-08-04 — End: 2015-08-06

## 2015-08-04 MED ORDER — ALBUTEROL SULFATE (2.5 MG/3ML) 0.083% IN NEBU
2.5000 mg | INHALATION_SOLUTION | Freq: Once | RESPIRATORY_TRACT | Status: AC
Start: 2015-08-04 — End: 2015-08-04
  Administered 2015-08-04: 2.5 mg via RESPIRATORY_TRACT

## 2015-08-04 MED ORDER — FLUTICASONE PROPIONATE 50 MCG/ACT NA SUSP
2.0000 | Freq: Every day | NASAL | Status: DC
Start: 2015-08-04 — End: 2016-06-22

## 2015-08-04 MED ORDER — IPRATROPIUM BROMIDE 0.02 % IN SOLN
0.5000 mg | Freq: Once | RESPIRATORY_TRACT | Status: AC
Start: 2015-08-04 — End: 2015-08-04
  Administered 2015-08-04: 0.5 mg via RESPIRATORY_TRACT

## 2015-08-04 MED ORDER — GUAIFENESIN-CODEINE 100-10 MG/5ML OR SYRP
10.0000 mL | ORAL_SOLUTION | Freq: Every evening | ORAL | Status: DC | PRN
Start: 2015-08-04 — End: 2015-08-08

## 2015-08-04 NOTE — Progress Notes (Signed)
[place] [facility]      Chief Complaint   Patient presents with   . Cough     Onset Wednesday/chest pain from coughing/slightly productive cough/feels like she can't catch a breath/can't sleep///hdcma       SUBJECTIVE:  Christina Mathews is an 45 year old female who presents with cough  Symptoms were mild starting 4 days ago  Having progressively worsening cough.  Has had wheezing and dyspnea  Subjective fever  Pain in the throat from coughing.    Taking herbs/vitamins  No tobacco history     Review of Systems   Constitutional: Positive for chills and malaise/fatigue. Negative for fever.   HENT: Positive for congestion. Negative for ear pain and sore throat.    Respiratory: Positive for cough, sputum production, shortness of breath and wheezing.    Cardiovascular: Positive for chest pain.   Gastrointestinal: Negative for nausea, vomiting and diarrhea.   Neurological: Positive for headaches.       I have  personally reviewed the medications, allergies, ROS with the patient.        Review of patient's allergies indicates:  Allergies   Allergen Reactions   . Acephen [Acetaminophen] Nausea/Vomiting   . Penicillins Itching and Swelling   . Sulfa Antibiotics Nausea/Vomiting and Swelling       OBJECTIVE:  BP 128/77 mmHg  Pulse 96  Temp(Src) 98.6 F (37 C) (Temporal)  Resp 12  Ht 5' 1.5" (1.562 m)  Wt 182 lb 1.6 oz (82.6 kg)  BMI 33.85 kg/m2  SpO2 97%  Physical Exam   Gen: A&O, NAD, ill appearing  Head: normocephalic, atraumatic  Eyes: PERRL, sclera and conjunctiva normal  Ears: Canals and TMs normal bilaterally  Nose congested  Throat: clear, no cervical adenopathy, mucous membranes moist  Lungs: shallow breathes to avoid coughing, faint wheezing, no crackles  Heart: RRR, no M/R/G    No significant improvement of respiratory symptoms following duoneb    A/P  1. Bronchitis  Symptomatic treatment: rest, push fluids, ibuprofen for discomfort, cough medicine prn  Follow up if symptoms  worsen or fail to resolve as anticipated.    - Benzonatate 100 MG Oral Cap; Take 1 capsule (100 mg) by mouth 3 times a day as needed for cough.  Dispense: 30 capsule; Refill: 0  - Guaifenesin-Codeine 100-10 MG/5ML Oral Syrup; Take 10 mL by mouth at bedtime as needed for cough.  Dispense: 120 mL; Refill: 0    2. Cough  - albuterol 2.5 mg/3 mL (0.083%) Inhalation solution; Inhale 3 mL (2.5 mg) via nebulizer One time.  - ipratropium 0.5 mg/2.5 mL (0.02%) inhalation solution; Inhale 2.5 mL (0.5 mg) via nebulizer One time.  - Benzonatate 100 MG Oral Cap; Take 1 capsule (100 mg) by mouth 3 times a day as needed for cough.  Dispense: 30 capsule; Refill: 0  - Guaifenesin-Codeine 100-10 MG/5ML Oral Syrup; Take 10 mL by mouth at bedtime as needed for cough.  Dispense: 120 mL; Refill: 0    3. Nasal congestion  Flonase, sudafed, sinus rinses prn  - Fluticasone Propionate (FLONASE) 50 MCG/ACT Nasal Suspension; Spray 2 sprays into each nostril daily.  Dispense: 1 bottle; Refill: 0

## 2015-08-04 NOTE — Patient Instructions (Signed)
Bronchitis, Viral (Adult)    You have a viral bronchitis. Bronchitis is inflammation and swelling of the lining of the lungs. This is often caused by an infection. Symptoms include a dry, hacking cough that is worse at night. The cough may bring up yellow-green mucus. You may also feel short of breath or wheeze. Other symptoms may include tiredness, chest discomfort, and chills.  Bronchitis that is caused by a virus is not treated with antibiotics. Instead, medicines may be given to help relieve symptoms. Symptoms can last up to 2 weeks, although the cough may last much longer.  This illness is contagious during the first few days and is spread through the air by coughing and sneezing, or by direct contact (touching the sick person and then touching your own eyes, nose, or mouth).  Most viral illnesses resolve within 10 to 14 days with rest and simple home remedies, although they may sometimes last for several weeks.  Home care   If symptoms are severe, rest at home for the first 2 to 3 days. When you go back to your usual activities, don't let yourself get too tired.   Do not smoke. Also avoid being exposed to secondhand smoke.   You may use over-the-counter medicine to control fever or pain, unless another pain medicine was prescribed. (Note: If you have chronic liver or kidney disease or have ever had a stomach ulcer or gastrointestinal bleeding, talk with your healthcare provider before using these medicines. Also talk to your provider if you are taking medicine to prevent blood clots.) Aspirin should never be given to anyone younger than 43 years of age who is ill with a viral infection or fever. It may cause severe liver or brain damage.   Your appetite may be poor, so a light diet is fine. Avoid dehydration by drinking 6 to 8 glasses of fluids per day (such as water, soft drinks, sports drinks, juices, tea, or soup). Extra fluids will help loosen secretions in the nose and lungs.   Over-the-counter  cough, cold, and sore-throat medicines will not shorten the length of the illness, but they may help to reduce symptoms. (Note: Do not use decongestants if you have high blood pressure.)  Follow-up care  Follow up with your healthcare provider, or as advised. If you had an X-ray or ECG (electrocardiogram), a specialist will review it. You will be notified of any new findings that may affect your care.  Note: If you are age 65 or older, or if you have a chronic lung disease or condition that affects your immune system, or you smoke, talk to your healthcare provider about having pneumococcal vaccinations and a yearly influenza vaccination (flu shot).  When to seek medical advice  Call your healthcare provider right away if any of these occur:   Fever of 100.4F (38C) or higher   Coughing up increased amounts of colored sputum   Weakness, drowsiness, headache, facial pain, ear pain, or a stiff neck  Call 911, or get immediate medical care  Contact emergency services right away if any of these occur:   Coughing up blood   Worsening weakness, drowsiness, headache, or stiff neck   Trouble breathing, wheezing, or pain with breathing   2000-2016 [employer] [address], [place] [address]. All rights reserved. This information is not intended as a substitute for professional medical care. Always follow your healthcare professional's instructions.

## 2015-08-06 ENCOUNTER — Ambulatory Visit (INDEPENDENT_AMBULATORY_CARE_PROVIDER_SITE_OTHER): Payer: No Typology Code available for payment source | Admitting: Family Medicine

## 2015-08-06 ENCOUNTER — Encounter (INDEPENDENT_AMBULATORY_CARE_PROVIDER_SITE_OTHER): Payer: Self-pay | Admitting: Family Medicine

## 2015-08-06 VITALS — BP 125/87 | HR 92 | Temp 98.4°F | Resp 20 | Ht 61.42 in | Wt 182.5 lb

## 2015-08-06 DIAGNOSIS — J029 Acute pharyngitis, unspecified: Secondary | ICD-10-CM

## 2015-08-06 DIAGNOSIS — R05 Cough: Secondary | ICD-10-CM

## 2015-08-06 DIAGNOSIS — J40 Bronchitis, not specified as acute or chronic: Secondary | ICD-10-CM

## 2015-08-06 DIAGNOSIS — R059 Cough, unspecified: Secondary | ICD-10-CM

## 2015-08-06 LAB — PR STREP TEST RAPID, ONSITE: Strep A Bacterial Ag: NEGATIVE

## 2015-08-06 MED ORDER — ALBUTEROL SULFATE (2.5 MG/3ML) 0.083% IN NEBU
2.5000 mg | INHALATION_SOLUTION | Freq: Once | RESPIRATORY_TRACT | Status: AC
Start: 2015-08-06 — End: 2015-08-06
  Administered 2015-08-06: 2.5 mg via RESPIRATORY_TRACT

## 2015-08-06 MED ORDER — AZITHROMYCIN 250 MG OR TABS
ORAL_TABLET | ORAL | Status: DC
Start: 2015-08-06 — End: 2015-08-08

## 2015-08-06 MED ORDER — BENZONATATE 100 MG OR CAPS
200.0000 mg | ORAL_CAPSULE | Freq: Three times a day (TID) | ORAL | Status: DC | PRN
Start: 2015-08-06 — End: 2016-06-22

## 2015-08-06 MED ORDER — HYDROCODONE-HOMATROPINE 5-1.5 MG/5ML OR SYRP
5.0000 mL | ORAL_SOLUTION | ORAL | Status: DC | PRN
Start: 2015-08-06 — End: 2015-08-08

## 2015-08-06 MED ORDER — PREDNISONE 20 MG OR TABS
40.0000 mg | ORAL_TABLET | Freq: Every day | ORAL | Status: AC
Start: 2015-08-06 — End: 2015-08-11

## 2015-08-06 NOTE — Progress Notes (Signed)
Patient Referred By: No ref. provider found  Patient's PCP: Hiram Comber, MD     Subjective:  Patient is a 45 year old female, here to discuss Follow Up Visit    The following portions of the patient's history were reviewed with the patient and updated as appropriate: problem list, current medications, allergies, past medical history, past surgical history, past social history and past family history.    Cough  This is a new problem. The current episode started in the past 7 days. The problem has been gradually worsening. The problem occurs constantly. The cough is productive of sputum. Associated symptoms include chills, myalgias, a sore throat and shortness of breath. She has tried prescription cough suppressant for the symptoms. The treatment provided no relief. Her past medical history is significant for bronchitis. There is no history of asthma.   sudden onset last Friday.  Saw Urgent care 2 days ago and worsening.  Sore throat and chest tightness, chills and no appetite.      Review of Systems   Constitutional: Positive for chills, activity change, appetite change and fatigue.   HENT: Positive for sore throat.    Respiratory: Positive for cough and shortness of breath.    Musculoskeletal: Positive for myalgias.         Objective:  BP 125/87 mmHg  Pulse 92  Temp(Src) 98.4 F (36.9 C) (Temporal)  Resp 20  Ht 5' 1.42" (1.56 m)  Wt 182 lb 8.7 oz (82.8 kg)  BMI 34.02 kg/m2  SpO2 97%  PF 250 L/min   Physical Exam   Constitutional: Vital signs are normal. No distress.   HENT:   Right Ear: Tympanic membrane normal.   Left Ear: Tympanic membrane normal.   Nose: Nose normal.   Mouth/Throat: Uvula is midline and mucous membranes are normal. Posterior oropharyngeal erythema present.   Eyes: Conjunctivae are normal.   Neck: Neck supple.   Cardiovascular: Normal rate, regular rhythm and normal heart sounds.    No murmur heard.  Pulmonary/Chest: Effort normal. No respiratory distress. She has decreased breath  sounds. She has no wheezes. She has no rales.   Lymphadenopathy:     She has cervical adenopathy.   Skin: No rash noted.     Quick streptococcal sore throat: negative      Assessment and Plan:   Diagnoses and all orders for this visit:    Bronchitis  - Counseled on etiology and treatment options, reviewed SIDE EFFECTS   Likely viral secondary to recent flu.   -     PredniSONE 20 MG Oral Tab; Take 2 tablets (40 mg) by mouth daily for 5 days.  -     Discontinue: Azithromycin 250 MG Oral Tab; Take 2 tablets today, then take 1 tablet every day until gone.  -     Benzonatate 100 MG Oral Cap; Take 2 capsules (200 mg) by mouth 3 times a day as needed for cough.  -     Discontinue: Hydrocodone-Homatropine 5-1.5 MG/5ML Oral Syrup; Take 5 mL by mouth every 4 hours as needed for cough.    Sore throat  -     STREP TEST RAPID, ONSITE  -     R/O BETA STREP CULTURE    Cough  -     albuterol 2.5 mg/3 mL (0.083%) Inhalation solution; Inhale 3 mL (2.5 mg) via nebulizer One time.  -     Benzonatate 100 MG Oral Cap; Take 2 capsules (200 mg) by mouth 3 times a day  as needed for cough.    Other orders  -     ZEBRA LABELS    Preliminary information was recorded by Clinical Staff. Recorded information was reviewed for accuracy and edited by Hiram ComberLaura Lataysha Vohra, MD.  I, Hiram ComberLaura Keelyn Monjaras, MD, personally confirmed the chief complaint with patient and completed HPI and physical exam.  Patient verbalized understanding and is agreeable to plan. No barriers in communication noted.  See also instructions.   RETURN TO CLINIC if no improvement or change.     (Portions of this note were documented using [employer] which is a Medical illustratorvoice dictation software.  Although edited, some words which may sound like other words could be found in this note.)

## 2015-08-06 NOTE — Progress Notes (Signed)
9:36 AM      Preventative Health Care Updates   Since your last visit, please tell us if you have had any of the below services outside of [facility]:     Cervical screening/PAP: NO  If yes, location/date.    Mammo:  NO  If yes, location/date.    Colon Screen: NO  If yes, location/ date.    Specialty Care Updates  Have seen a specialist since your last visit: NO   If yes, Name, location and date.    Any forms to complete today? NO       HM Due:   PAP      Health Maintenance   Topic Date Due   . Pap Smear  11/23/2014   . Diabetes Screen  05/16/2017   . Tetanus Vaccine  11/23/2023   . HIV Screen  Completed           No future appointments.    9:43 AM

## 2015-08-06 NOTE — Progress Notes (Signed)
Administrations This Visit     albuterol 2.5 mg/3 mL (0.083%) Inhalation solution     Admin Date Action Dose Route Administered By             08/06/2015 Given 2.5 mg Nebulization McLane-England, Kendra, MA                          Medication given today without initial adverse effect. YES  Patient was here for an additional 20 minutes after medication.     Sudie BaileyKendra McLane-England Certified Medical Assistant

## 2015-08-06 NOTE — Patient Instructions (Signed)
Prednisone 3-5 days for lung congestion.     Today you were prescribed a narcotic pain medication. Side effects can include fatigue, constipation and nausea. Do not operate a car or heavy machinery while using this medication. Do not drink alcohol with this medication. Narcotics can be habit forming if taken over a prolonged period of time. This prescription is for short term use only. Lost or stolen narcotic medications or prescriptions cannot be replaced. Please schedule an office visit with the prescribing provider if you believe a refill of this medication is needed, so that your condition can be reevaluated in person. Refills of this prescription will not be done by phone.     Start antibiotics only if persisting.

## 2015-08-07 ENCOUNTER — Encounter (INDEPENDENT_AMBULATORY_CARE_PROVIDER_SITE_OTHER): Payer: Self-pay | Admitting: Family Medicine

## 2015-08-08 ENCOUNTER — Encounter (INDEPENDENT_AMBULATORY_CARE_PROVIDER_SITE_OTHER): Payer: Self-pay | Admitting: Family Medicine

## 2015-08-08 ENCOUNTER — Ambulatory Visit (INDEPENDENT_AMBULATORY_CARE_PROVIDER_SITE_OTHER): Payer: No Typology Code available for payment source | Admitting: Family Medicine

## 2015-08-08 VITALS — BP 120/81 | HR 82 | Temp 98.9°F | Resp 20 | Ht 61.42 in | Wt 182.0 lb

## 2015-08-08 DIAGNOSIS — J40 Bronchitis, not specified as acute or chronic: Secondary | ICD-10-CM

## 2015-08-08 DIAGNOSIS — B379 Candidiasis, unspecified: Secondary | ICD-10-CM

## 2015-08-08 MED ORDER — HYDROCODONE-HOMATROPINE 5-1.5 MG/5ML OR SYRP
5.0000 mL | ORAL_SOLUTION | ORAL | Status: DC | PRN
Start: 2015-08-08 — End: 2016-06-22

## 2015-08-08 MED ORDER — FLUCONAZOLE 150 MG OR TABS
150.0000 mg | ORAL_TABLET | Freq: Once | ORAL | Status: AC
Start: 2015-08-08 — End: 2015-08-08

## 2015-08-08 MED ORDER — DOXYCYCLINE HYCLATE 100 MG OR CAPS
100.0000 mg | ORAL_CAPSULE | Freq: Two times a day (BID) | ORAL | Status: AC
Start: 2015-08-08 — End: 2015-08-18

## 2015-08-08 NOTE — Progress Notes (Signed)
11:19 AM      Preventative Health Care Updates   Since your last visit, please tell us if you have had any of the below services outside of [facility]:     Cervical screening/PAP: NO  If yes, location/date.    Mammo:  NO  If yes, location/date.    Colon Screen: NO  If yes, location/ date.    Specialty Care Updates  Have seen a specialist since your last visit: NO   If yes, Name, location and date.    Any forms to complete today? NO       HM Due:   UTD      Health Maintenance   Topic Date Due   . Pap Smear  11/22/2016   . Diabetes Screen  05/16/2017   . Tetanus Vaccine  11/23/2023   . HIV Screen  Completed           No future appointments.    11:27 AM

## 2015-08-08 NOTE — Telephone Encounter (Signed)
Provider responded to patient via eCare that she should be seen to check for pneumonia.    Routing comment from provider:    Fit in before 230pm    Spoke with patient and scheduled for 11:15. Nothing further needed.  Closing encounter.

## 2015-08-08 NOTE — Patient Instructions (Signed)
It was a pleasure to see you in clinic today.     You can schedule an appointment to see us by calling [phone] or via eCare.     If labs were ordered today the results are expected to be available via eCare within 2 weeks. Otherwise, result letters are mailed 7-10 days after your tests are completed. If your physician needs to change your care based on your results, you will receive a phone call to notify you. If you haven't heard from him/her and it has been more than 10 days please give us a call.     If you are not yet signed up for eCare, please speak with a team member at the front desk. eCare enrollment will allow you to make appointments online, view test results and obtain a copy of our After Visit Summary    Thank you for choosing [facility].    --------------------------------------------------------------------------------------------------------------

## 2015-08-08 NOTE — Telephone Encounter (Signed)
Patient seen at [facility] 08/[facility]/15 and her 08/06/15 for bronchitis.  She reports that cough is persisting, and now she has strained muscles under her left breast. New symptom reported is eye discharge.  Patient wondering if this is a side effect from the medication.  Routing to provider - please advise.

## 2015-08-08 NOTE — Progress Notes (Signed)
Patient Referred By: No ref. provider found  Patient's PCP: Hiram Comber, MD     Subjective:  Patient is a 45 year old female, here to discuss Follow Up Visit and Eye Problem    The following portions of the patient's history were reviewed with the patient and updated as appropriate: problem list, current medications, allergies, past medical history, past surgical history, past social history and past family history.    URI   This is a new problem. The current episode started 1 to 4 weeks ago. The problem has been gradually worsening. Associated symptoms include chest pain, congestion, coughing, headaches, neck pain and swollen glands. Pertinent negatives include no dysuria, rash or wheezing. She has tried inhaler use for the symptoms. The treatment provided no relief.   Cough  This is a new problem. The current episode started 1 to 4 weeks ago. The problem has been gradually worsening. The problem occurs constantly. The cough is productive of sputum. Associated symptoms include chest pain, eye redness and headaches. Pertinent negatives include no rash or wheezing. She has tried oral steroids, rest and prescription cough suppressant for the symptoms. The treatment provided mild relief. Her past medical history is significant for bronchitis. There is no history of asthma.       Review of Systems   HENT: Positive for congestion.    Eyes: Positive for redness. Negative for photophobia, pain, itching and visual disturbance.   Respiratory: Positive for cough. Negative for wheezing.    Cardiovascular: Positive for chest pain.   Genitourinary: Negative for dysuria.   Musculoskeletal: Positive for neck pain.   Skin: Negative for rash.   Neurological: Positive for headaches.         Objective:  BP 120/81 mmHg  Pulse 82  Temp(Src) 98.9 F (37.2 C) (Oral)  Resp 20  Ht 5' 1.42" (1.56 m)  Wt 182 lb (82.555 kg)  BMI 33.92 kg/m2  SpO2 96%   Physical Exam   Constitutional: Vital signs are normal. She has a sickly  appearance. She appears ill. No distress.   HENT:   Right Ear: Tympanic membrane normal.   Left Ear: Tympanic membrane normal.   Nose: Mucosal edema present.   Mouth/Throat: Uvula is midline, oropharynx is clear and moist and mucous membranes are normal.   Eyes: Conjunctivae are normal.   Slight erythema of conjunctiva bilaterally without discharge seen.    Neck: Neck supple.   Cardiovascular: Normal rate, regular rhythm and normal heart sounds.    No murmur heard.  Pulmonary/Chest: Effort normal. No respiratory distress. She has decreased breath sounds. She has no wheezes. She has no rales.   Lymphadenopathy:     She has cervical adenopathy.   Skin: No rash noted.        Assessment and Plan:   Diagnoses and all orders for this visit:    Bronchitis  - secondary to influenza  Likely bacterial vs. Atypical pneumonia.   Will treatment with doxycycline to cover also for sinus.    -     XR CHEST 2 VW  -     Doxycycline Hyclate 100 MG Oral Cap; Take 1 capsule (100 mg) by mouth 2 times a day for 10 days.  -     Hydrocodone-Homatropine 5-1.5 MG/5ML Oral Syrup; Take 5 mL by mouth every 4 hours as needed for cough.    Yeast infection  - prescription given only to use if needed as patient gets yeast infection with antibiotic use    -  Fluconazole (DIFLUCAN) 150 MG Oral Tab; Take 1 tablet (150 mg) by mouth One time for 1 dose.    Other orders  -     ZEBRA LABELS      Preliminary information was recorded by Clinical Staff. Recorded information was reviewed for accuracy and edited by Hiram ComberLaura Haruye Lainez, MD.  I, Hiram ComberLaura Ariyah Sedlack, MD, personally confirmed the chief complaint with patient and completed HPI and physical exam.  Patient verbalized understanding and is agreeable to plan. No barriers in communication noted.  See also instructions.   RETURN TO CLINIC if no improvement or change.     (Portions of this note were documented using [employer] which is a Medical illustratorvoice dictation software.  Although edited, some words which may  sound like other words could be found in this note.)

## 2015-08-09 LAB — R/O BETA STREP CULTURE

## 2015-09-01 ENCOUNTER — Other Ambulatory Visit (INDEPENDENT_AMBULATORY_CARE_PROVIDER_SITE_OTHER): Payer: Self-pay

## 2015-09-01 NOTE — Progress Notes (Signed)
Please send out letter #1 to patient. RN care manager to reach out via phone week of 09/04/15

## 2015-09-03 NOTE — Progress Notes (Signed)
Introduction to care management letter sent to patient home address.

## 2015-10-03 ENCOUNTER — Encounter (INDEPENDENT_AMBULATORY_CARE_PROVIDER_SITE_OTHER): Payer: Self-pay | Admitting: Family Medicine

## 2015-10-03 DIAGNOSIS — G43809 Other migraine, not intractable, without status migrainosus: Secondary | ICD-10-CM

## 2015-10-04 MED ORDER — SUMATRIPTAN SUCCINATE 25 MG OR TABS
25.0000 mg | ORAL_TABLET | ORAL | Status: DC | PRN
Start: 2015-10-04 — End: 2016-06-22

## 2015-10-04 NOTE — Telephone Encounter (Signed)
Informed patient of approved refill. Nothing further needed.  Closing encounter.

## 2015-10-04 NOTE — Telephone Encounter (Signed)
Received refill request from Christina JohnsStephanie Jean Mathews for SUMAtriptan Succinate 25 MG Oral Tab    Last fill of SUMAtriptan Succinate 25 MG Oral Tab was on 04/23/2014 for #20 tablet with 2 refills  Last OV was on 08/08/2015 with Dr. Ninfa MeekerMontour.    Pharmacy and medication loaded below. Ok to refill?

## 2015-12-25 ENCOUNTER — Other Ambulatory Visit (INDEPENDENT_AMBULATORY_CARE_PROVIDER_SITE_OTHER): Payer: Self-pay | Admitting: Family Medicine

## 2015-12-25 DIAGNOSIS — IMO0001 Reserved for inherently not codable concepts without codable children: Secondary | ICD-10-CM

## 2015-12-26 MED ORDER — NECON 0.5/35 (28) 0.5-35 MG-MCG OR TABS
ORAL_TABLET | ORAL | 1 refills | Status: DC
Start: 2015-12-26 — End: 2016-05-23

## 2016-05-23 ENCOUNTER — Other Ambulatory Visit (INDEPENDENT_AMBULATORY_CARE_PROVIDER_SITE_OTHER): Payer: Self-pay | Admitting: Family Medicine

## 2016-05-23 DIAGNOSIS — IMO0001 Reserved for inherently not codable concepts without codable children: Secondary | ICD-10-CM

## 2016-05-25 MED ORDER — NECON 0.5/35 (28) 0.5-35 MG-MCG OR TABS
ORAL_TABLET | ORAL | 0 refills | Status: DC
Start: 2016-05-25 — End: 2016-06-22

## 2016-06-08 ENCOUNTER — Other Ambulatory Visit: Payer: Self-pay | Admitting: Family Medicine

## 2016-06-08 DIAGNOSIS — N63 Unspecified lump in unspecified breast: Secondary | ICD-10-CM

## 2016-06-16 ENCOUNTER — Other Ambulatory Visit (HOSPITAL_BASED_OUTPATIENT_CLINIC_OR_DEPARTMENT_OTHER): Payer: No Typology Code available for payment source

## 2016-06-22 ENCOUNTER — Other Ambulatory Visit (HOSPITAL_BASED_OUTPATIENT_CLINIC_OR_DEPARTMENT_OTHER)
Admit: 2016-06-22 | Discharge: 2016-06-22 | Disposition: A | Discharge status: Home / Self Care | Payer: No Typology Code available for payment source | Attending: Family Medicine | Admitting: Family Medicine

## 2016-06-22 ENCOUNTER — Encounter (INDEPENDENT_AMBULATORY_CARE_PROVIDER_SITE_OTHER): Payer: Self-pay | Admitting: Family Medicine

## 2016-06-22 ENCOUNTER — Ambulatory Visit (INDEPENDENT_AMBULATORY_CARE_PROVIDER_SITE_OTHER): Payer: No Typology Code available for payment source | Admitting: Family Medicine

## 2016-06-22 ENCOUNTER — Telehealth (INDEPENDENT_AMBULATORY_CARE_PROVIDER_SITE_OTHER): Payer: Self-pay | Admitting: Family Medicine

## 2016-06-22 VITALS — BP 117/84 | HR 72 | Temp 97.4°F | Resp 16 | Ht 61.42 in | Wt 186.5 lb

## 2016-06-22 DIAGNOSIS — Z83511 Family history of glaucoma: Secondary | ICD-10-CM | POA: Insufficient documentation

## 2016-06-22 DIAGNOSIS — F419 Anxiety disorder, unspecified: Secondary | ICD-10-CM

## 2016-06-22 DIAGNOSIS — Z833 Family history of diabetes mellitus: Secondary | ICD-10-CM

## 2016-06-22 DIAGNOSIS — Z6834 Body mass index (BMI) 34.0-34.9, adult: Secondary | ICD-10-CM

## 2016-06-22 DIAGNOSIS — Z3041 Encounter for surveillance of contraceptive pills: Secondary | ICD-10-CM

## 2016-06-22 DIAGNOSIS — E041 Nontoxic single thyroid nodule: Secondary | ICD-10-CM

## 2016-06-22 DIAGNOSIS — M7989 Other specified soft tissue disorders: Secondary | ICD-10-CM

## 2016-06-22 DIAGNOSIS — G43809 Other migraine, not intractable, without status migrainosus: Secondary | ICD-10-CM

## 2016-06-22 DIAGNOSIS — Z13220 Encounter for screening for lipoid disorders: Secondary | ICD-10-CM

## 2016-06-22 DIAGNOSIS — Z124 Encounter for screening for malignant neoplasm of cervix: Secondary | ICD-10-CM | POA: Insufficient documentation

## 2016-06-22 DIAGNOSIS — M62838 Other muscle spasm: Secondary | ICD-10-CM

## 2016-06-22 DIAGNOSIS — E222 Syndrome of inappropriate secretion of antidiuretic hormone: Secondary | ICD-10-CM

## 2016-06-22 DIAGNOSIS — Z0000 Encounter for general adult medical examination without abnormal findings: Secondary | ICD-10-CM

## 2016-06-22 DIAGNOSIS — R5383 Other fatigue: Secondary | ICD-10-CM

## 2016-06-22 DIAGNOSIS — Z1151 Encounter for screening for human papillomavirus (HPV): Secondary | ICD-10-CM | POA: Insufficient documentation

## 2016-06-22 DIAGNOSIS — Z131 Encounter for screening for diabetes mellitus: Secondary | ICD-10-CM

## 2016-06-22 DIAGNOSIS — F331 Major depressive disorder, recurrent, moderate: Secondary | ICD-10-CM | POA: Insufficient documentation

## 2016-06-22 LAB — COMPREHENSIVE METABOLIC PANEL
ALT (GPT): 17 U/L (ref 7–33)
AST (GOT): 16 U/L (ref 9–38)
Albumin: 4 g/dL (ref 3.5–5.2)
Alkaline Phosphatase (Total): 82 U/L (ref 25–112)
Anion Gap: 11 (ref 4–12)
Bilirubin (Total): 0.4 mg/dL (ref 0.2–1.3)
Calcium: 9.4 mg/dL (ref 8.9–10.2)
Carbon Dioxide, Total: 24 meq/L (ref 22–32)
Chloride: 104 meq/L (ref 98–108)
Creatinine: 0.88 mg/dL (ref 0.38–1.02)
GFR, Calc, African American: >60 mL/min/{1.73_m2} (ref >59)
GFR, Calc, European American: >60 mL/min/{1.73_m2} (ref >59)
Glucose: 76 mg/dL (ref 62–125)
Potassium: 4.5 meq/L (ref 3.6–5.2)
Protein (Total): 6.9 g/dL (ref 6.0–8.2)
Sodium: 139 meq/L (ref 135–145)
Urea Nitrogen: 9 mg/dL (ref 8–21)

## 2016-06-22 LAB — LIPID PANEL
Cholesterol (LDL): 82 mg/dL (ref <130)
Cholesterol/HDL Ratio: 2.4
HDL Cholesterol: 68 mg/dL (ref >39)
Non-HDL Cholesterol: 92 mg/dL (ref 0–159)
Total Cholesterol: 160 mg/dL (ref <200)
Triglyceride: 48 mg/dL (ref <150)

## 2016-06-22 LAB — CBC (HEMOGRAM)
Hematocrit: 44 % (ref 36–45)
Hemoglobin: 14.9 g/dL (ref 11.5–15.5)
MCH: 29.8 pg (ref 27.3–33.6)
MCHC: 33.7 g/dL (ref 32.2–36.5)
MCV: 88 fL (ref 81–98)
Platelet Count: 272 10*3/uL (ref 150–400)
RBC: 5 10*6/uL (ref 3.80–5.00)
RDW-CV: 12.4 % (ref 11.6–14.4)
WBC: 9.89 10*3/uL (ref 4.3–10.0)

## 2016-06-22 LAB — PR A1C RAPID, ONSITE: Hemoglobin A1C: 5.3 % (ref 4.0–6.0)

## 2016-06-22 LAB — THYROID STIMULATING HORMONE: Thyroid Stimulating Hormone: 1.54 u[IU]/mL (ref 0.400–5.000)

## 2016-06-22 MED ORDER — SUMATRIPTAN SUCCINATE 50 MG OR TABS
50.0000 mg | ORAL_TABLET | ORAL | 3 refills | Status: DC | PRN
Start: 2016-06-22 — End: 2017-11-12

## 2016-06-22 MED ORDER — NORETHINDRONE-ETH ESTRADIOL 0.5-35 MG-MCG OR TABS
ORAL_TABLET | ORAL | 3 refills | Status: DC
Start: 2016-06-22 — End: 2017-02-28

## 2016-06-22 MED ORDER — CYCLOBENZAPRINE HCL 10 MG OR TABS
10.0000 mg | ORAL_TABLET | Freq: Three times a day (TID) | ORAL | 1 refills | Status: AC | PRN
Start: 2016-06-22 — End: ?

## 2016-06-22 MED ORDER — LORAZEPAM 0.5 MG OR TABS
0.5000 mg | ORAL_TABLET | Freq: Three times a day (TID) | ORAL | 0 refills | Status: DC | PRN
Start: 2016-06-22 — End: 2016-10-06

## 2016-06-22 NOTE — Progress Notes (Signed)
Christina Mathews is a 45 year old female here today for a preventive health visit.   Other problems or concerns today: refills needed.  Stress recently with father's illness, marriage breakdown.  History of depression but patient did not tolerate antidepressants in the past.     GYN HISTORY  Obstetric History     No data available     No LMP recorded. Patient is not currently having periods (Reason: Continuous OCP's).   Periods are rare  Cyclic symptoms: none  Intermenstrual bleeding, spotting,or discharge: No   Last pap: 2015  Pap history: LEEP: 1998  Contraception: no - abstinent  Other gyn history: none    SEXUAL HISTORY  Sexual activity: not at present  Age at first intercourse: Not asked today    Chlamydia screening offered: yes  HIV screening offered: yes  HPV vaccine offered: No  Sexual concerns: Not asked today  History of sexual or physical abuse: Not asked today  Have you been hit, kicked, punched, or otherwise hurt by someone within the past year:Not asked today    CANCER SCREENING  Family history of colon cancer: NO  Family history of uterine or ovarian cancer: NO  Family history of breast cancer: YES: aunt     Prior mammogram: YES   History of abnormal mammogram: NO    LIFESTYLE  Current dietary habits: healthy diet in general  Calcium: vitamin D supplements:  YES  Current exercise habits: walking - low intensity stroll  Regular seat belt use: YES  Substance use:  reports that she has never smoked. She has never used smokeless tobacco. She reports that she does not drink alcohol or use drugs.  Stress: Yes: see above   Exposure to hazardous materials: Not asked today  Guns in the house: Not asked today    History sections of chart reviewed and updated today: YES    Review Of Systems  Weight: increase   Constitutional: positive for fatigue  Regular dental exams: yes  GI: positive for chronic RUQ pain since gallbladder surgery  GU: denies abnormal vaginal bleeding, discharge or unusual pelvic pain,  denies dysuria, frequency or gross hematuria  Endocrine: negative   Skin: denies rash or other active skin concerns      EXAM:  BP 117/84   Pulse 72   Temp 97.4 F (36.3 C) (Temporal)   Resp 16   Ht 5' 1.42" (1.56 m)   Wt 186 lb 8.2 oz (84.6 kg)   SpO2 98%   BMI 34.76 kg/m   Body mass index is 34.76 kg/m.  General appearance: healthy, alert, no distress  Skin: Skin color, texture, turgor normal. No rashes or concerning lesions  Neck: supple. No adenopathy. Thyroid symmetric, normal size, without nodules  Breasts: No obvious deformity or mass to inspection, nipples everted bilaterally, no skin lesion or nipple discharge, no mass palpated, positive findings: fullness right axilla with tenderness  Lungs: clear to auscultation  Heart: normal rate, regular rhythm  Abdomen: soft, non-tender. BS normal. No masses or organomegaly  Pelvic: normal bartholin/skene/urethral meatus/anus., Vagina is rugated and well-estrogenized, cervix normal in appearance   Rectal: deferred    ASSESSMENT/PLAN:  Health Maintenance   Topic Date Due   . Cervical Cancer Screening (Pap Smear)  11/22/2016   . Diabetes Screening  05/16/2017   . Tetanus Vaccine  11/23/2023   . HIV Screening  Completed     Immunizations or studies due: None    (Z00.00) Encounter for general adult medical examination w/o  abnormal findings  (primary encounter diagnosis)  Plan: PAP SMEAR (OPTIONAL HPV), LIPID PANEL,         COMPREHENSIVE METABOLIC PANEL            (M79.89) Arm swelling  Plan: US BREAST        Chronic issue.  Seen by hematology.  Ultrasounds obtained.  Started after having PICC line.      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      (Y78.295(M62.838) Muscle spasm  Plan: Cyclobenzaprine HCl 10 MG Oral Tab        Refill for as needed use.     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, not intractable  Plan: SUMAtriptan Succinate 50 MG Oral Tab        Refill for as needed use      (F41.9) Anxiety  Plan: LORazepam 0.5 MG Oral Tab        Counseled on etiology and treatment options, reviewed SIDE EFFECTS    RETURN TO CLINIC as needed     (R53.83) Fatigue, unspecified type  Plan: THYROID STIMULATING HORMONE, CBC (HEMOGRAM)            (Z83.3) Family history of diabetes mellitus in father  Plan: screening today     (Z68.34) BMI 34.0-34.9,adult  Plan: REFERRAL TO NUTRITION            (Z30.41) Encounter for surveillance of contraceptive pills  Plan: Norethindrone-Eth Estradiol (NECON 0.5/35, 28,)        0.5-35 MG-MCG Oral Tab        Refill. No contraindications.       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  Patient will think about restarting lexapro or wellbutrin. Patient will notify office by ecare if any change.             Preventive counseling: proper exercise  Follow-up: 1 year  Lab tests and results of any diagnostic studies will be shared with the patient by  eCare

## 2016-06-22 NOTE — Telephone Encounter (Signed)
Imaging [facility] to [facility] for 11/28.

## 2016-06-22 NOTE — Progress Notes (Signed)
9:46 AM  9:54 AM          Preventative Health Care Updates   Since your last visit, please tell us if you have had any of the below services outside of [facility]:     Cervical screening/PAP: NO  If yes, location/date.    Mammo:  NO  If yes, location/date.    Colon Screen: NO  If yes, location/ date.    Specialty Care Updates  Have seen a specialist since your last visit: NO   If yes, Name, location and date.    Any forms to complete today? NO       HM Due:   PAP, DM scrn      Health Maintenance   Topic Date Due   . Cervical Cancer Screening (Pap Smear)  11/22/2016   . Diabetes Screening  05/16/2017   . Tetanus Vaccine  11/23/2023   . HIV Screening  Completed           Future Appointments  Date Time Provider Department Center   06/23/2016 8:40 AM [facility]   06/23/2016 9:00 AM [facility]

## 2016-06-22 NOTE — Telephone Encounter (Signed)
[facility]     Please add ultrasound. She still has a swelling in her right axilla that I want assessed.     Dr. Hiram ComberLaura Sabel Hornbeck MD  [facility]  [address], [address]  [place], [place]  [id number]  [facility] [phone]  FAX [phone]

## 2016-06-23 ENCOUNTER — Ambulatory Visit: Payer: No Typology Code available for payment source | Attending: Family Medicine

## 2016-06-23 ENCOUNTER — Other Ambulatory Visit: Payer: Self-pay | Admitting: Family Medicine

## 2016-06-23 ENCOUNTER — Ambulatory Visit (HOSPITAL_BASED_OUTPATIENT_CLINIC_OR_DEPARTMENT_OTHER): Payer: No Typology Code available for payment source

## 2016-06-23 DIAGNOSIS — N6459 Other signs and symptoms in breast: Secondary | ICD-10-CM | POA: Insufficient documentation

## 2016-06-23 DIAGNOSIS — N63 Unspecified lump in unspecified breast: Secondary | ICD-10-CM

## 2016-06-23 DIAGNOSIS — N644 Mastodynia: Secondary | ICD-10-CM

## 2016-06-25 LAB — CERVICAL CANCER SCREENING
Cytologic Impression: NEGATIVE
LAB AP HISTORIC HPV PRESENCE: NEGATIVE

## 2016-06-25 LAB — HPV ONLY
Cytologic Impression: NEGATIVE
HPV Presence: NEGATIVE

## 2016-07-22 ENCOUNTER — Ambulatory Visit (INDEPENDENT_AMBULATORY_CARE_PROVIDER_SITE_OTHER): Payer: No Typology Code available for payment source

## 2016-09-21 ENCOUNTER — Telehealth (INDEPENDENT_AMBULATORY_CARE_PROVIDER_SITE_OTHER): Payer: Self-pay | Admitting: Family Medicine

## 2016-09-21 ENCOUNTER — Encounter (INDEPENDENT_AMBULATORY_CARE_PROVIDER_SITE_OTHER): Payer: Self-pay | Admitting: Family Medicine

## 2016-09-21 ENCOUNTER — Ambulatory Visit (INDEPENDENT_AMBULATORY_CARE_PROVIDER_SITE_OTHER): Payer: Self-pay | Admitting: Family Medicine

## 2016-09-21 DIAGNOSIS — F331 Major depressive disorder, recurrent, moderate: Secondary | ICD-10-CM

## 2016-09-21 MED ORDER — ESCITALOPRAM OXALATE 5 MG OR TABS
5.0000 mg | ORAL_TABLET | Freq: Every evening | ORAL | 0 refills | Status: DC
Start: 2016-09-21 — End: 2017-10-22

## 2016-09-21 NOTE — Telephone Encounter (Signed)
TELEPHONE TRIAGE ENCOUNTER CREATED TO ADDRESS THIS MESSAGE.  Laurena Spiesarol Magally Vahle, RN 09/21/16 at 10:50 AM

## 2016-09-21 NOTE — Addendum Note (Signed)
Addended by: Hiram ComberMONTOUR, Sheila Ocasio on: 09/21/2016 11:31 AM     Modules accepted: Orders

## 2016-09-21 NOTE — Telephone Encounter (Signed)
Informed patient of provider message.  Postponing 24 hours to check for response.
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
## 2016-09-21 NOTE — Telephone Encounter (Signed)
Patient is reporting issues with nausea recently and mid to lower abdomen/pelivc pain.  She notes pain radiates into her thigh and hip.  Routing to nurse triage for evaluation.

## 2016-09-21 NOTE — Telephone Encounter (Signed)
Send 5mg  lexapro.  This is the safest without seeing patient.   She should follow up within 2 weeks.     Dr. Hiram ComberLaura Rashidah Belleville MD  [facility]  [address], [address]  [place], [place]  [id number]  [facility] [phone]  FAX [phone]
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
## 2016-09-22 NOTE — Telephone Encounter (Signed)
Patient has read eCare and not responded. Nothing further needed.  Closing encounter.

## 2016-10-06 ENCOUNTER — Encounter (INDEPENDENT_AMBULATORY_CARE_PROVIDER_SITE_OTHER): Payer: Self-pay | Admitting: Family Medicine

## 2016-10-06 ENCOUNTER — Ambulatory Visit (INDEPENDENT_AMBULATORY_CARE_PROVIDER_SITE_OTHER): Payer: No Typology Code available for payment source | Admitting: Family Medicine

## 2016-10-06 VITALS — BP 126/78 | HR 82 | Temp 98.5°F | Resp 12 | Wt 185.0 lb

## 2016-10-06 DIAGNOSIS — F419 Anxiety disorder, unspecified: Secondary | ICD-10-CM

## 2016-10-06 DIAGNOSIS — M6281 Muscle weakness (generalized): Secondary | ICD-10-CM

## 2016-10-06 DIAGNOSIS — F331 Major depressive disorder, recurrent, moderate: Secondary | ICD-10-CM

## 2016-10-06 DIAGNOSIS — Z6834 Body mass index (BMI) 34.0-34.9, adult: Secondary | ICD-10-CM

## 2016-10-06 LAB — COMPREHENSIVE METABOLIC PANEL
ALT (GPT): 19 U/L (ref 7–33)
AST (GOT): 16 U/L (ref 9–38)
Albumin: 4.2 g/dL (ref 3.5–5.2)
Alkaline Phosphatase (Total): 77 U/L (ref 25–112)
Anion Gap: 9 (ref 4–12)
Bilirubin (Total): 0.4 mg/dL (ref 0.2–1.3)
Calcium: 9.6 mg/dL (ref 8.9–10.2)
Carbon Dioxide, Total: 27 meq/L (ref 22–32)
Chloride: 104 meq/L (ref 98–108)
Creatinine: 0.88 mg/dL (ref 0.38–1.02)
GFR, Calc, African American: >60 mL/min/{1.73_m2} (ref >59)
GFR, Calc, European American: >60 mL/min/{1.73_m2} (ref >59)
Glucose: 114 mg/dL (ref 62–125)
Potassium: 4.6 meq/L (ref 3.6–5.2)
Protein (Total): 6.9 g/dL (ref 6.0–8.2)
Sodium: 140 meq/L (ref 135–145)
Urea Nitrogen: 9 mg/dL (ref 8–21)

## 2016-10-06 LAB — CBC (HEMOGRAM)
Hematocrit: 46 % — ABNORMAL HIGH (ref 36–45)
Hemoglobin: 15.3 g/dL (ref 11.5–15.5)
MCH: 30.4 pg (ref 27.3–33.6)
MCHC: 33.6 g/dL (ref 32.2–36.5)
MCV: 90 fL (ref 81–98)
Platelet Count: 296 10*3/uL (ref 150–400)
RBC: 5.04 10*6/uL — ABNORMAL HIGH (ref 3.80–5.00)
RDW-CV: 12.5 % (ref 11.6–14.4)
WBC: 11.3 10*3/uL — ABNORMAL HIGH (ref 4.3–10.0)

## 2016-10-06 LAB — THYROID STIMULATING HORMONE: Thyroid Stimulating Hormone: 1.295 u[IU]/mL (ref 0.400–5.000)

## 2016-10-06 MED ORDER — ESCITALOPRAM OXALATE 10 MG OR TABS
10.0000 mg | ORAL_TABLET | Freq: Every evening | ORAL | 12 refills | Status: DC
Start: 2016-10-06 — End: 2017-10-22

## 2016-10-06 MED ORDER — LORAZEPAM 0.5 MG OR TABS
ORAL_TABLET | ORAL | 0 refills | Status: AC
Start: 2016-10-06 — End: ?

## 2016-10-06 NOTE — Progress Notes (Signed)
1:39 PM  1:44 PM      Preventative Health Care Updates   Since your last visit, please tell us if you have had any of the below services outside of [facility]:     Cervical screening/PAP: NO  If yes, location/date.    Mammo:  NO  If yes, location/date.    Colon Screen: NO  If yes, location/ date.    Specialty Care Updates  Have seen a specialist since your last visit: NO   If yes, Name, location and date.    Any forms to complete today? NO       HM Due:     UTD    Health Maintenance   Topic Date Due   . Depression Monitoring (PHQ-9)  12/20/2016   . Cervical Cancer Screening (Pap Smear)  06/23/2019   . Diabetes Screening  06/23/2019   . Tetanus Vaccine  11/23/2023   . HIV Screening  Completed           Future Appointments  Date Time Provider Department Center   10/06/2016 1:40 PM Montour, Vernona RiegerLaura, MD [facility]     Docia ChuckLianna Devereaux, [place]
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
## 2016-12-17 ENCOUNTER — Other Ambulatory Visit: Payer: Self-pay

## 2017-02-28 ENCOUNTER — Other Ambulatory Visit (INDEPENDENT_AMBULATORY_CARE_PROVIDER_SITE_OTHER): Payer: Self-pay | Admitting: Family Medicine

## 2017-02-28 DIAGNOSIS — Z3041 Encounter for surveillance of contraceptive pills: Secondary | ICD-10-CM

## 2017-03-02 MED ORDER — NECON 0.5/35 (28) 0.5-35 MG-MCG OR TABS
ORAL_TABLET | ORAL | 0 refills | Status: DC
Start: 2017-03-02 — End: 2017-05-20

## 2017-03-02 NOTE — Telephone Encounter (Signed)
[facility] from [facility],  Last filled 01/26/17        Comment/Action: Authorized one refill, appointment due Fall 2018

## 2017-05-14 ENCOUNTER — Encounter (INDEPENDENT_AMBULATORY_CARE_PROVIDER_SITE_OTHER): Payer: BLUE CROSS/BLUE SHIELD | Admitting: Family Medicine

## 2017-05-20 ENCOUNTER — Other Ambulatory Visit (INDEPENDENT_AMBULATORY_CARE_PROVIDER_SITE_OTHER): Payer: Self-pay | Admitting: Family Medicine

## 2017-05-20 DIAGNOSIS — Z3041 Encounter for surveillance of contraceptive pills: Secondary | ICD-10-CM

## 2017-05-21 MED ORDER — NECON 0.5/35 (28) 0.5-35 MG-MCG OR TABS
ORAL_TABLET | ORAL | 0 refills | Status: DC
Start: 2017-05-21 — End: 2017-08-14

## 2017-06-21 ENCOUNTER — Encounter (INDEPENDENT_AMBULATORY_CARE_PROVIDER_SITE_OTHER): Payer: No Typology Code available for payment source | Admitting: Family Medicine

## 2017-07-05 ENCOUNTER — Encounter (INDEPENDENT_AMBULATORY_CARE_PROVIDER_SITE_OTHER): Payer: Self-pay | Admitting: Family Medicine

## 2017-07-05 DIAGNOSIS — A09 Infectious gastroenteritis and colitis, unspecified: Secondary | ICD-10-CM

## 2017-07-05 DIAGNOSIS — B379 Candidiasis, unspecified: Secondary | ICD-10-CM

## 2017-07-07 MED ORDER — FLUCONAZOLE 150 MG OR TABS
ORAL_TABLET | ORAL | 1 refills | Status: DC
Start: 2017-07-07 — End: 2018-05-12

## 2017-07-07 MED ORDER — CIPROFLOXACIN HCL 500 MG OR TABS
500.0000 mg | ORAL_TABLET | Freq: Two times a day (BID) | ORAL | 0 refills | Status: AC
Start: 2017-07-07 — End: 2017-07-10

## 2017-07-07 NOTE — Telephone Encounter (Signed)
Sent eCare message informing patient of approved refills.     8-10 am check if read.

## 2017-07-07 NOTE — Telephone Encounter (Signed)
Routing eCare message to provider.     Patient requesting Fluconazole and Rx for traveler's diarrhea for trip to [place] at the end of this month.     Preferred pharmacy not found for eRx.  [facility] to fax if provider would like.  Please review and advise.  Thank you.

## 2017-07-08 NOTE — Telephone Encounter (Signed)
eCare message read - no patient response.    Closing TE - no further action needed.

## 2017-08-14 ENCOUNTER — Other Ambulatory Visit (INDEPENDENT_AMBULATORY_CARE_PROVIDER_SITE_OTHER): Payer: Self-pay | Admitting: Family Medicine

## 2017-08-14 DIAGNOSIS — Z3041 Encounter for surveillance of contraceptive pills: Secondary | ICD-10-CM

## 2017-08-17 MED ORDER — NORETHINDRONE-ETH ESTRADIOL 0.5-35 MG-MCG OR TABS
1.0000 | ORAL_TABLET | Freq: Every day | ORAL | 0 refills | Status: DC
Start: 2017-08-17 — End: 2017-11-12

## 2017-08-17 NOTE — Telephone Encounter (Signed)
Patient last seen on 10/06/16 and was to return in 4 weeks.  One refill authorized.  Please schedule follow up visit.

## 2017-08-17 NOTE — Telephone Encounter (Signed)
Sent an eCare message. Will leave open for 24 hours to allow for pt's response

## 2017-08-18 NOTE — Telephone Encounter (Signed)
Patient read eCare message on 08/17/2017 at 2:14 pm.    No patient response-Closing TE

## 2017-10-22 ENCOUNTER — Other Ambulatory Visit (INDEPENDENT_AMBULATORY_CARE_PROVIDER_SITE_OTHER): Payer: Self-pay | Admitting: Family Medicine

## 2017-10-22 DIAGNOSIS — F331 Major depressive disorder, recurrent, moderate: Secondary | ICD-10-CM

## 2017-10-22 MED ORDER — ESCITALOPRAM OXALATE 10 MG OR TABS
ORAL_TABLET | ORAL | 2 refills | Status: DC
Start: 2017-10-22 — End: 2017-11-12

## 2017-11-12 ENCOUNTER — Encounter (INDEPENDENT_AMBULATORY_CARE_PROVIDER_SITE_OTHER): Payer: Self-pay | Admitting: Family Medicine

## 2017-11-12 ENCOUNTER — Ambulatory Visit (INDEPENDENT_AMBULATORY_CARE_PROVIDER_SITE_OTHER): Payer: BLUE CROSS/BLUE SHIELD | Admitting: Family Medicine

## 2017-11-12 VITALS — BP 124/79 | HR 75 | Temp 98.0°F | Ht 62.21 in | Wt 188.5 lb

## 2017-11-12 DIAGNOSIS — E669 Obesity, unspecified: Secondary | ICD-10-CM

## 2017-11-12 DIAGNOSIS — R109 Unspecified abdominal pain: Secondary | ICD-10-CM

## 2017-11-12 DIAGNOSIS — Z9189 Other specified personal risk factors, not elsewhere classified: Secondary | ICD-10-CM

## 2017-11-12 DIAGNOSIS — Z0000 Encounter for general adult medical examination without abnormal findings: Secondary | ICD-10-CM

## 2017-11-12 DIAGNOSIS — R5383 Other fatigue: Secondary | ICD-10-CM

## 2017-11-12 DIAGNOSIS — S069X9D Unspecified intracranial injury with loss of consciousness of unspecified duration, subsequent encounter: Secondary | ICD-10-CM

## 2017-11-12 DIAGNOSIS — Z1239 Encounter for other screening for malignant neoplasm of breast: Secondary | ICD-10-CM

## 2017-11-12 DIAGNOSIS — F331 Major depressive disorder, recurrent, moderate: Secondary | ICD-10-CM

## 2017-11-12 DIAGNOSIS — E041 Nontoxic single thyroid nodule: Secondary | ICD-10-CM

## 2017-11-12 DIAGNOSIS — G43809 Other migraine, not intractable, without status migrainosus: Secondary | ICD-10-CM

## 2017-11-12 DIAGNOSIS — Z3041 Encounter for surveillance of contraceptive pills: Secondary | ICD-10-CM

## 2017-11-12 DIAGNOSIS — E222 Syndrome of inappropriate secretion of antidiuretic hormone: Secondary | ICD-10-CM

## 2017-11-12 MED ORDER — NORETHINDRONE-ETH ESTRADIOL 0.5-35 MG-MCG OR TABS
1.0000 | ORAL_TABLET | Freq: Every day | ORAL | 0 refills | Status: DC
Start: 2017-11-12 — End: 2018-05-12

## 2017-11-12 MED ORDER — SUMATRIPTAN SUCCINATE 50 MG OR TABS
50.0000 mg | ORAL_TABLET | ORAL | 3 refills | Status: DC | PRN
Start: 2017-11-12 — End: 2018-05-12

## 2017-11-12 MED ORDER — BUPROPION HCL ER (XL) 150 MG OR TB24
150.0000 mg | EXTENDED_RELEASE_TABLET | Freq: Every day | ORAL | 3 refills | Status: AC
Start: 2017-11-12 — End: ?

## 2017-11-12 MED ORDER — ESCITALOPRAM OXALATE 10 MG OR TABS
10.0000 mg | ORAL_TABLET | Freq: Every evening | ORAL | 3 refills | Status: DC
Start: 2017-11-12 — End: 2018-05-12

## 2017-11-12 NOTE — Patient Instructions (Addendum)
2119 calories per day to maintain weight.     To lose weight- [phone] (0.5 to 1lb per week).   200-300 min moderate intense cardio.      Laboratory tests- 12 hour fast, water only     Last thyroid ultrasound 2013- small 5mm nodes- no follow recommended but I might order an ultrasound again after I see your thyroid laboratory tests

## 2017-11-12 NOTE — Progress Notes (Signed)
Patient Referred By: No ref. provider found  Patient's PCP: Hiram Comber, MD     Subjective:  Patient is a 47 year old female, here to discuss Wellness    The following portions of the patient's history were reviewed with the patient and updated as appropriate: problem list, current medications, allergies, past medical history, past surgical history, past social history and past family history.    Patient is here for wellness and follow up on depression    Over the last year patient has had stress with marital issues and father being ill.  Patient has been back and forth out of state to help with father.   Low energy and anhedonia.     No substance use.    Patient taking lexapro 10mg  and this helps anxiety.         Review of Systems   Constitutional: Positive for fatigue. Negative for unexpected weight change.   Respiratory: Negative for cough.    Cardiovascular: Negative for chest pain.   Psychiatric/Behavioral: Positive for sleep disturbance.         Objective:  BP 124/79    Pulse 75    Temp 98 F (36.7 C) (Temporal)    Ht 5' 2.21" (1.58 m)    Wt 188 lb 7.9 oz (85.5 kg)    SpO2 96%    BMI 34.25 kg/m    Physical Exam   Constitutional: No distress.   Eyes: No scleral icterus.   Psychiatric: She has a normal mood and affect. Her behavior is normal. Judgment and thought content normal.   Vitals reviewed.      Over the last 2 weeks how often have you been bothered by any of the following problems?  1. Little interest or pleasure in doing things: (!) More than half the days  2. Feeling down, depressed or hopeless: Several days  3. Trouble falling or staying asleep, or sleeping too much: (!) Nearly every day  4. Feeling tired or having little energy : (!) Nearly every day  5. Poor appetite or overeating: Several days  6. Feeling bad about yourself - or that you are a failure or have let yourself or your family down: Several days  7. Trouble concentrating on things, such as reading the newspaper or watching  television: Several days  8. Moving or speaking much more slowly than usual.  Or the opposite - fidgety or restless: Several days  9. Thoughts that you would be better off dead or of hurting yourself in some way: (!) Several days    Total  PHQ9 Total Score: 14    10. If you checked off any problems, how difficult have these problems made it for you to do your work, take care of things at home, or get along with other people?: Somewhat difficult      GAD7 Score, Total: 5  GAD7 Score, Difficulty: If you checked any problems, how difficult have they made it for you to do your work, take care of things at home, or get along with other people?: Somewhat difficult        Assessment and Plan:   Diagnoses and all orders for this visit:      Moderate episode of recurrent major depressive disorder (HCC)  - Counseled on etiology and treatment options, reviewed SIDE EFFECTS    See in 1 month  -     Escitalopram Oxalate 10 MG Oral Tab; Take 1 tablet (10 mg) by mouth at bedtime.  -  buPROPion HCl XL 150 MG Oral TABLET SR 24 HR; Take 1 tablet (150 mg) by mouth daily.

## 2017-11-12 NOTE — Progress Notes (Signed)
Preventative Health Care Updates   Since your last visit, please tell us if you have had any of the below services outside of [facility]:                                                                            Cervical screening/PAP:N/A      Mammo: N/A      Colon Screen: NO   If yes, location/ date.    Specialty Care Updates  Have seen a specialist since your last visit: NO   If yes, Name, location and date.    Any forms to complete today? NO     HM Due: UTD        Health Maintenance   Topic Date Due    Depression Monitoring (PHQ-9)  10/06/2017    Cervical Cancer Screening  06/23/2019    Diabetes Screening  10/07/2019    Lipid Disorders Screening  06/22/2021    Tetanus Vaccine  11/23/2023    HIV Screening  Completed           Future Appointments   Date Time Provider Department Center   11/12/2017 11:00 AM Montour, Vernona RiegerLaura, MD [facility]

## 2017-11-12 NOTE — Progress Notes (Signed)
Christina Mathews is a 47 year old female here today for a preventive health visit.   Other problems or concerns today: see other note.     GYN HISTORY  OB History   No data available       No LMP recorded. Patient is not currently having periods (Reason: Continuous OCP's).   Crampy, lower abdominal pain during periods: none  Cyclic symptoms: none  Intermenstrual bleeding, spotting,or discharge: No   Last pap: 2017  Pap history: LEEP: 1998  Other gyn history: none      CANCER SCREENING  Family history of colon cancer: NO  Family history of uterine or ovarian cancer: NO  Family history of breast cancer: YES:        Prior mammogram: YES    History of abnormal mammogram: NO    LIFESTYLE  Current dietary habits: healthy diet in general  Calcium: dietary sources only  Current exercise habits: yes, engages in regular exercise  Regular seat belt use: YES  Substance use:  reports that she has never smoked. She has never used smokeless tobacco. She reports that she drinks alcohol. She reports that she does not use drugs.  Exposure to hazardous materials: Not asked today  Guns in the house: Not asked today      EXAM:  BP 124/79    Pulse 75    Temp 98 F (36.7 C) (Temporal)    Ht 5' 2.21" (1.58 m)    Wt 188 lb 7.9 oz (85.5 kg)    SpO2 96%    BMI 34.25 kg/m    General: healthy, alert, no distress  Head: Normocephalic. No masses, lesions, tenderness or abnormalities  supple. No adenopathy. Thyroid symmetric, normal size, without nodules  Lungs: chest clear   Heart: normal rate, regular rhythm  Skin: Skin color, texture, turgor normal. No rashes or concerning lesions  Abdomen: soft.  Tender right sided abdomen   Neuro:  Grossly normal to observation, gait normal  Breasts: No obvious deformity or mass to inspection, nipples everted bilaterally, no skin lesion or nipple discharge, no mass palpated, no axillary lymphadenopathy  Pelvic exam: deferred    ASSESSMENT/PLAN:    1. Encounter for general adult medical examination w/o  abnormal findings  - Comprehensive Metabolic Panel; Future  - Thyroid Stimulating Hormone; Future  - Lipid Panel; Future  - CBC; Future    2. High risk for diabetes mellitus  - A1C RAPID, ONSITE; Future    3. Obesity (BMI 30-39.9)  Counseled on diet and lifestyle.   Follow uin 4 weeks.   - Vitamin D (25 Hydroxy); Future    4. Screening for breast cancer  - MAM MAMMOGRAPHY SCREEN W/TOMO BILAT; Future    5. Encounter for surveillance of contraceptive pills  No contraindications.   CONTINUE CURRENT MANAGEMENT   - Norethindrone-Eth Estradiol (NECON 0.5/35, 28,) 0.5-35 MG-MCG Oral Tab; Take 1 tablet by mouth daily. Skipping placebo tablets.  Dispense: 112 tablet; Refill: 0    6. Other migraine without status migrainosus, not intractable  Refill provided   - SUMAtriptan Succinate 50 MG Oral Tab; Take 1 tablet (50 mg) by mouth every 2 hours as needed for migraines. Take at onset of migraine. May repeat x 1 dose. Max 200 mg/24 hrs  Dispense: 20 tablet; Refill: 3    7. Fatigue, unspecified type  - Vitamin B12 (Cobalamin); Future    8. Right sided abdominal pain  Noted on exam.   Laboratory tests. Consider ultrasound    9.  Traumatic brain injury with loss of consciousness, subsequent encounter  - consider referral.  Chronic symptoms for the most part    10.Thyroid nodule  - incidental finding in 2013.  Laboratory tests today. Patient does feel some swelling. Will do ultrasound possibly after laboratory tests reviewed.       Health Maintenance   Topic Date Due    Depression Monitoring (PHQ-9)  10/06/2017    Cervical Cancer Screening  06/23/2019    Diabetes Screening  10/07/2019    Lipid Disorders Screening  06/22/2021    Tetanus Vaccine  11/23/2023    HIV Screening  Completed       Immunizations or studies due: none - up to date  Chlamydia screening offered: declined  HIV screening offered: declined  HPV vaccine offered: No    Preventive counseling: vitamin D supplementation  proper exercise  Follow-up: 4 weeks

## 2017-11-13 ENCOUNTER — Other Ambulatory Visit (INDEPENDENT_AMBULATORY_CARE_PROVIDER_SITE_OTHER): Payer: BLUE CROSS/BLUE SHIELD

## 2017-11-13 ENCOUNTER — Other Ambulatory Visit (INDEPENDENT_AMBULATORY_CARE_PROVIDER_SITE_OTHER): Payer: Self-pay | Admitting: Family Medicine

## 2017-11-13 DIAGNOSIS — Z0000 Encounter for general adult medical examination without abnormal findings: Secondary | ICD-10-CM

## 2017-11-13 DIAGNOSIS — R5383 Other fatigue: Secondary | ICD-10-CM

## 2017-11-13 DIAGNOSIS — Z9189 Other specified personal risk factors, not elsewhere classified: Secondary | ICD-10-CM

## 2017-11-13 DIAGNOSIS — E669 Obesity, unspecified: Secondary | ICD-10-CM

## 2017-11-13 LAB — COMPREHENSIVE METABOLIC PANEL
ALT (GPT): 17 U/L (ref 7–33)
AST (GOT): 15 U/L (ref 9–38)
Albumin: 4 g/dL (ref 3.5–5.2)
Alkaline Phosphatase (Total): 78 U/L (ref 34–121)
Anion Gap: 11 (ref 4–12)
Bilirubin (Total): 0.4 mg/dL (ref 0.2–1.3)
Calcium: 9 mg/dL (ref 8.9–10.2)
Carbon Dioxide, Total: 22 meq/L (ref 22–32)
Chloride: 103 meq/L (ref 98–108)
Creatinine: 0.8 mg/dL (ref 0.38–1.02)
GFR, Calc, African American: >60 mL/min/{1.73_m2} (ref >59)
GFR, Calc, European American: >60 mL/min/{1.73_m2} (ref >59)
Glucose: 87 mg/dL (ref 62–125)
Potassium: 4.6 meq/L (ref 3.6–5.2)
Protein (Total): 6.8 g/dL (ref 6.0–8.2)
Sodium: 136 meq/L (ref 135–145)
Urea Nitrogen: 11 mg/dL (ref 8–21)

## 2017-11-13 LAB — LIPID PANEL
Cholesterol (LDL): 80 mg/dL (ref <130)
Cholesterol/HDL Ratio: 2.3
HDL Cholesterol: 68 mg/dL (ref >39)
Non-HDL Cholesterol: 89 mg/dL (ref 0–159)
Total Cholesterol: 157 mg/dL (ref <200)
Triglyceride: 47 mg/dL (ref <150)

## 2017-11-13 LAB — CBC (HEMOGRAM)
Hematocrit: 44 % (ref 36–45)
Hemoglobin: 14.6 g/dL (ref 11.5–15.5)
MCH: 29.3 pg (ref 27.3–33.6)
MCHC: 32.9 g/dL (ref 32.2–36.5)
MCV: 89 fL (ref 81–98)
Platelet Count: 284 10*3/uL (ref 150–400)
RBC: 4.98 10*6/uL (ref 3.80–5.00)
RDW-CV: 12.6 % (ref 11.6–14.4)
WBC: 10.03 10*3/uL — ABNORMAL HIGH (ref 4.3–10.0)

## 2017-11-13 LAB — VITAMIN B12 (COBALAMIN): Vitamin B12 (Cobalamin): 606 pg/mL (ref 180–914)

## 2017-11-13 LAB — THYROID STIMULATING HORMONE: Thyroid Stimulating Hormone: 1.486 u[IU]/mL (ref 0.400–5.000)

## 2017-11-13 NOTE — Progress Notes (Signed)
Courtesy blood draw for Dr. Ninfa MeekerMontour at [id number].    Tests drawn: VIT B12, VIT D, CBC, LIPID, A1C, TSH,CMP

## 2017-11-16 LAB — VITAMIN D (25 HYDROXY)
Vit D (25_Hydroxy) Interp: HIGH
Vit D (25_Hydroxy) Total: 67.3 ng/mL — ABNORMAL HIGH (ref 20.1–50.0)
Vitamin D2 (25_Hydroxy): 5.3 ng/mL
Vitamin D3 (25_Hydroxy): 62 ng/mL

## 2017-12-13 ENCOUNTER — Ambulatory Visit (INDEPENDENT_AMBULATORY_CARE_PROVIDER_SITE_OTHER): Payer: Self-pay | Admitting: Family Medicine

## 2017-12-13 NOTE — Telephone Encounter (Signed)
Christina Mathews is a 47 year old female reporting head injury on Saturday afternoon 12/11/17.  She had bent down to pick something off the floor, and when standing up she hit the right side of her head hard on the granite counter top.     She is out of town in [place] and did not go to ED after the injury.    Developed a bad HA on Sunday 12/12/17 with mild tingling on L side of her face.  She denies numbness or weakness.      Worsening HA today.  Poor appetite.  No vomiting.  Tingling sensation on L side of face continues today.    She reports history of other head injury in 2001.     Plan:  Laquitta has someone with her and agrees to go to nearby ED [facility] now.  WE discussed 9-1-1 for acutely worsening symptoms.   Routing to Dr. Ninfa Meeker as [facility].     Reason for Disposition   Sounds like a serious injury to the triager   Severe headache    Additional Information   Negative: Knocked out (unconscious) > 1 minute   Negative: Sounds like a life-threatening emergency to the triager   Negative: Vomiting once or more   Negative: Watery or blood-tinged fluid dripping from the nose or ears   Negative: Can't remember what happened (amnesia)    Protocols used: HEAD INJURY-ADULT-[place]

## 2017-12-13 NOTE — Telephone Encounter (Signed)
Routing to nurse triage.    Patient states she is out of town, and that she has a history of head injury. Patient hit her head very hard on a granite countertop.     Please advise. Thank you.

## 2018-05-12 ENCOUNTER — Other Ambulatory Visit (INDEPENDENT_AMBULATORY_CARE_PROVIDER_SITE_OTHER): Payer: Self-pay | Admitting: Family Medicine

## 2018-05-12 DIAGNOSIS — Z3041 Encounter for surveillance of contraceptive pills: Secondary | ICD-10-CM

## 2018-05-12 DIAGNOSIS — B379 Candidiasis, unspecified: Secondary | ICD-10-CM

## 2018-05-12 DIAGNOSIS — G43809 Other migraine, not intractable, without status migrainosus: Secondary | ICD-10-CM

## 2018-05-12 DIAGNOSIS — F331 Major depressive disorder, recurrent, moderate: Secondary | ICD-10-CM

## 2018-05-13 MED ORDER — ESCITALOPRAM OXALATE 10 MG OR TABS
10.0000 mg | ORAL_TABLET | Freq: Every evening | ORAL | 1 refills | Status: AC
Start: 2018-05-13 — End: ?

## 2018-05-13 MED ORDER — FLUCONAZOLE 150 MG OR TABS
ORAL_TABLET | ORAL | 1 refills | Status: AC
Start: 2018-05-13 — End: ?

## 2018-05-13 MED ORDER — SUMATRIPTAN SUCCINATE 50 MG OR TABS
50.0000 mg | ORAL_TABLET | ORAL | 0 refills | Status: AC | PRN
Start: 2018-05-13 — End: ?

## 2018-05-13 MED ORDER — NORETHINDRONE-ETH ESTRADIOL 0.5-35 MG-MCG OR TABS
1.0000 | ORAL_TABLET | Freq: Every day | ORAL | 1 refills | Status: DC
Start: 2018-05-13 — End: 2018-11-08

## 2018-05-13 NOTE — Telephone Encounter (Signed)
The requested medication requires your authorization because it is outside of the Refill Authorization Center's protocols:  Fluconazole

## 2018-11-08 ENCOUNTER — Other Ambulatory Visit (INDEPENDENT_AMBULATORY_CARE_PROVIDER_SITE_OTHER): Payer: Self-pay | Admitting: Family Medicine

## 2018-11-08 DIAGNOSIS — Z3041 Encounter for surveillance of contraceptive pills: Secondary | ICD-10-CM

## 2018-11-09 NOTE — Telephone Encounter (Signed)
We are contacting you because the provider, Hiram Comber , is no longer at the clinic and patient has not established care with a new PCP.  This falls outside of the Refill Authorization Center's protocols.  Please have you or your staff inform the patient and schedule an appointment if necessary.    Last exam 11/12/2017 and was to return in 1 month

## 2018-11-10 MED ORDER — NECON 0.5/35 (28) 0.5-35 MG-MCG OR TABS
1.0000 | ORAL_TABLET | Freq: Every day | ORAL | 0 refills | Status: AC
Start: 2018-11-10 — End: ?

## 2018-11-10 NOTE — Telephone Encounter (Signed)
Routing to covering provider for consideration.     Patient requesting refill of norethindrone-ethinyl estradiol (Necon 0.5/35 (28)) 0.5-35 MG-MCG tablet     Last OV: 11/12/17    Rx and pharmacy pended for consideration.  [facility] will also ask front desk to assist patient with scheduling wellness.  Please review and advise.  Thank you.

## 2018-11-11 NOTE — Telephone Encounter (Signed)
Routing to front desk.  Please inform patient Rx sent and assist with scheduling OV for wellness and establishing PCP.

## 2018-11-11 NOTE — Telephone Encounter (Signed)
[facility] message send informing patient that medication was sent to their pharmacy and asked that they call back to schedule an appointment.    CCRs- please assist with scheduling.       Postponed 24 hours to allow for patient response.

## 2018-11-14 NOTE — Telephone Encounter (Signed)
E-care message read. No patient response.     Closing TE - no further action needed.

## 7363-10-26 DEATH — deceased
